# Patient Record
Sex: Female | Born: 1950 | Race: White | Hispanic: No | Marital: Married | Smoking: Never smoker
Health system: Southern US, Community
[De-identification: ages and names within clinical notes are randomized; demographics above are authoritative.]

## PROBLEM LIST (undated history)

## (undated) DIAGNOSIS — B029 Zoster without complications: Secondary | ICD-10-CM

## (undated) DIAGNOSIS — D0510 Intraductal carcinoma in situ of unspecified breast: Secondary | ICD-10-CM

## (undated) DIAGNOSIS — H269 Unspecified cataract: Secondary | ICD-10-CM

## (undated) DIAGNOSIS — Z87442 Personal history of urinary calculi: Secondary | ICD-10-CM

## (undated) DIAGNOSIS — Z853 Personal history of malignant neoplasm of breast: Secondary | ICD-10-CM

## (undated) DIAGNOSIS — R87619 Unspecified abnormal cytological findings in specimens from cervix uteri: Secondary | ICD-10-CM

## (undated) DIAGNOSIS — F419 Anxiety disorder, unspecified: Secondary | ICD-10-CM

## (undated) DIAGNOSIS — R011 Cardiac murmur, unspecified: Secondary | ICD-10-CM

## (undated) DIAGNOSIS — K589 Irritable bowel syndrome without diarrhea: Secondary | ICD-10-CM

## (undated) DIAGNOSIS — T7840XA Allergy, unspecified, initial encounter: Secondary | ICD-10-CM

## (undated) DIAGNOSIS — M8580 Other specified disorders of bone density and structure, unspecified site: Secondary | ICD-10-CM

## (undated) DIAGNOSIS — N189 Chronic kidney disease, unspecified: Secondary | ICD-10-CM

## (undated) DIAGNOSIS — Z923 Personal history of irradiation: Secondary | ICD-10-CM

## (undated) HISTORY — DX: Personal history of malignant neoplasm of breast: Z85.3

## (undated) HISTORY — DX: Cardiac murmur, unspecified: R01.1

## (undated) HISTORY — DX: Unspecified abnormal cytological findings in specimens from cervix uteri: R87.619

## (undated) HISTORY — PX: WISDOM TOOTH EXTRACTION: SHX21

## (undated) HISTORY — DX: Irritable bowel syndrome, unspecified: K58.9

## (undated) HISTORY — DX: Unspecified cataract: H26.9

## (undated) HISTORY — DX: Other specified disorders of bone density and structure, unspecified site: M85.80

## (undated) HISTORY — DX: Allergy, unspecified, initial encounter: T78.40XA

## (undated) HISTORY — PX: CARPAL TUNNEL RELEASE: SHX101

## (undated) HISTORY — DX: Zoster without complications: B02.9

## (undated) HISTORY — DX: Intraductal carcinoma in situ of unspecified breast: D05.10

## (undated) HISTORY — DX: Anxiety disorder, unspecified: F41.9

---

## 1984-03-28 HISTORY — PX: OTHER SURGICAL HISTORY: SHX169

## 1997-09-24 ENCOUNTER — Other Ambulatory Visit: Admission: RE | Admit: 1997-09-24 | Discharge: 1997-09-24 | Payer: Self-pay | Admitting: *Deleted

## 1998-09-30 ENCOUNTER — Other Ambulatory Visit: Admission: RE | Admit: 1998-09-30 | Discharge: 1998-09-30 | Payer: Self-pay | Admitting: *Deleted

## 1998-10-12 ENCOUNTER — Ambulatory Visit (HOSPITAL_COMMUNITY): Admission: RE | Admit: 1998-10-12 | Discharge: 1998-10-12 | Payer: Self-pay | Admitting: *Deleted

## 1999-01-27 ENCOUNTER — Other Ambulatory Visit: Admission: RE | Admit: 1999-01-27 | Discharge: 1999-01-27 | Payer: Self-pay | Admitting: *Deleted

## 1999-02-10 ENCOUNTER — Encounter (INDEPENDENT_AMBULATORY_CARE_PROVIDER_SITE_OTHER): Payer: Self-pay

## 1999-02-10 ENCOUNTER — Other Ambulatory Visit: Admission: RE | Admit: 1999-02-10 | Discharge: 1999-02-10 | Payer: Self-pay | Admitting: *Deleted

## 1999-06-23 ENCOUNTER — Other Ambulatory Visit: Admission: RE | Admit: 1999-06-23 | Discharge: 1999-06-23 | Payer: Self-pay | Admitting: *Deleted

## 1999-09-22 ENCOUNTER — Other Ambulatory Visit: Admission: RE | Admit: 1999-09-22 | Discharge: 1999-09-22 | Payer: Self-pay | Admitting: *Deleted

## 1999-10-14 ENCOUNTER — Encounter: Payer: Self-pay | Admitting: General Surgery

## 1999-10-14 ENCOUNTER — Ambulatory Visit (HOSPITAL_COMMUNITY): Admission: RE | Admit: 1999-10-14 | Discharge: 1999-10-14 | Payer: Self-pay | Admitting: General Surgery

## 2000-10-20 ENCOUNTER — Other Ambulatory Visit: Admission: RE | Admit: 2000-10-20 | Discharge: 2000-10-20 | Payer: Self-pay | Admitting: *Deleted

## 2000-10-24 ENCOUNTER — Encounter: Payer: Self-pay | Admitting: *Deleted

## 2000-10-24 ENCOUNTER — Ambulatory Visit (HOSPITAL_COMMUNITY): Admission: RE | Admit: 2000-10-24 | Discharge: 2000-10-24 | Payer: Self-pay | Admitting: *Deleted

## 2001-11-23 ENCOUNTER — Encounter: Payer: Self-pay | Admitting: *Deleted

## 2001-11-23 ENCOUNTER — Ambulatory Visit (HOSPITAL_COMMUNITY): Admission: RE | Admit: 2001-11-23 | Discharge: 2001-11-23 | Payer: Self-pay | Admitting: *Deleted

## 2001-11-23 ENCOUNTER — Other Ambulatory Visit: Admission: RE | Admit: 2001-11-23 | Discharge: 2001-11-23 | Payer: Self-pay | Admitting: *Deleted

## 2002-12-11 ENCOUNTER — Ambulatory Visit (HOSPITAL_COMMUNITY): Admission: RE | Admit: 2002-12-11 | Discharge: 2002-12-11 | Payer: Self-pay | Admitting: *Deleted

## 2002-12-11 ENCOUNTER — Encounter: Payer: Self-pay | Admitting: *Deleted

## 2002-12-11 ENCOUNTER — Other Ambulatory Visit: Admission: RE | Admit: 2002-12-11 | Discharge: 2002-12-11 | Payer: Self-pay | Admitting: *Deleted

## 2004-01-12 ENCOUNTER — Ambulatory Visit (HOSPITAL_COMMUNITY): Admission: RE | Admit: 2004-01-12 | Discharge: 2004-01-12 | Payer: Self-pay | Admitting: General Surgery

## 2004-01-12 ENCOUNTER — Other Ambulatory Visit: Admission: RE | Admit: 2004-01-12 | Discharge: 2004-01-12 | Payer: Self-pay | Admitting: *Deleted

## 2004-12-13 ENCOUNTER — Ambulatory Visit (HOSPITAL_COMMUNITY): Admission: RE | Admit: 2004-12-13 | Discharge: 2004-12-13 | Payer: Self-pay | Admitting: General Surgery

## 2004-12-13 ENCOUNTER — Other Ambulatory Visit: Admission: RE | Admit: 2004-12-13 | Discharge: 2004-12-13 | Payer: Self-pay | Admitting: *Deleted

## 2005-07-26 HISTORY — PX: COLONOSCOPY: SHX174

## 2005-12-15 ENCOUNTER — Ambulatory Visit (HOSPITAL_COMMUNITY): Admission: RE | Admit: 2005-12-15 | Discharge: 2005-12-15 | Payer: Self-pay | Admitting: General Surgery

## 2006-01-26 ENCOUNTER — Other Ambulatory Visit: Admission: RE | Admit: 2006-01-26 | Discharge: 2006-01-26 | Payer: Self-pay | Admitting: Obstetrics & Gynecology

## 2007-01-27 DIAGNOSIS — D0510 Intraductal carcinoma in situ of unspecified breast: Secondary | ICD-10-CM

## 2007-01-27 HISTORY — PX: BREAST EXCISIONAL BIOPSY: SUR124

## 2007-01-27 HISTORY — PX: BREAST LUMPECTOMY: SHX2

## 2007-01-27 HISTORY — DX: Intraductal carcinoma in situ of unspecified breast: D05.10

## 2007-02-01 ENCOUNTER — Ambulatory Visit (HOSPITAL_COMMUNITY): Admission: RE | Admit: 2007-02-01 | Discharge: 2007-02-01 | Payer: Self-pay | Admitting: General Surgery

## 2007-02-01 ENCOUNTER — Other Ambulatory Visit: Admission: RE | Admit: 2007-02-01 | Discharge: 2007-02-01 | Payer: Self-pay | Admitting: Obstetrics & Gynecology

## 2007-02-05 ENCOUNTER — Encounter: Admission: RE | Admit: 2007-02-05 | Discharge: 2007-02-05 | Payer: Self-pay | Admitting: Obstetrics & Gynecology

## 2007-02-07 ENCOUNTER — Encounter (INDEPENDENT_AMBULATORY_CARE_PROVIDER_SITE_OTHER): Payer: Self-pay | Admitting: Diagnostic Radiology

## 2007-02-07 ENCOUNTER — Encounter: Admission: RE | Admit: 2007-02-07 | Discharge: 2007-02-07 | Payer: Self-pay | Admitting: Obstetrics & Gynecology

## 2007-02-07 DIAGNOSIS — Z853 Personal history of malignant neoplasm of breast: Secondary | ICD-10-CM | POA: Insufficient documentation

## 2007-02-13 ENCOUNTER — Encounter: Admission: RE | Admit: 2007-02-13 | Discharge: 2007-02-13 | Payer: Self-pay | Admitting: Obstetrics & Gynecology

## 2007-02-15 ENCOUNTER — Encounter: Admission: RE | Admit: 2007-02-15 | Discharge: 2007-02-15 | Payer: Self-pay | Admitting: Surgery

## 2007-02-15 ENCOUNTER — Encounter (INDEPENDENT_AMBULATORY_CARE_PROVIDER_SITE_OTHER): Payer: Self-pay | Admitting: Surgery

## 2007-02-15 ENCOUNTER — Ambulatory Visit (HOSPITAL_COMMUNITY): Admission: RE | Admit: 2007-02-15 | Discharge: 2007-02-15 | Payer: Self-pay | Admitting: Surgery

## 2007-02-26 ENCOUNTER — Ambulatory Visit: Payer: Self-pay | Admitting: Oncology

## 2007-03-05 ENCOUNTER — Ambulatory Visit: Admission: RE | Admit: 2007-03-05 | Discharge: 2007-03-28 | Payer: Self-pay | Admitting: Radiation Oncology

## 2007-03-19 LAB — CBC WITH DIFFERENTIAL/PLATELET
BASO%: 0.5 % (ref 0.0–2.0)
Basophils Absolute: 0 10*3/uL (ref 0.0–0.1)
EOS%: 0.9 % (ref 0.0–7.0)
LYMPH%: 29.1 % (ref 14.0–48.0)
MCV: 92.8 fL (ref 81.0–101.0)
MONO#: 0.4 10*3/uL (ref 0.1–0.9)
MONO%: 7.6 % (ref 0.0–13.0)
RBC: 3.6 10*6/uL — ABNORMAL LOW (ref 3.70–5.32)
RDW: 13.9 % (ref 11.3–14.5)
WBC: 4.8 10*3/uL (ref 3.9–10.0)

## 2007-03-19 LAB — COMPREHENSIVE METABOLIC PANEL
Albumin: 4.4 g/dL (ref 3.5–5.2)
CO2: 28 mEq/L (ref 19–32)
Creatinine, Ser: 1.05 mg/dL (ref 0.40–1.20)
Potassium: 3.7 mEq/L (ref 3.5–5.3)
Sodium: 138 mEq/L (ref 135–145)
Total Bilirubin: 0.6 mg/dL (ref 0.3–1.2)
Total Protein: 7.2 g/dL (ref 6.0–8.3)

## 2007-03-23 LAB — VITAMIN D PNL(25-HYDRXY+1,25-DIHY)-BLD
Vit D, 1,25-Dihydroxy: 45 pg/mL (ref 6–62)
Vit D, 25-Hydroxy: 45 ng/mL (ref 30–89)

## 2007-03-29 ENCOUNTER — Ambulatory Visit: Admission: RE | Admit: 2007-03-29 | Discharge: 2007-06-01 | Payer: Self-pay | Admitting: Radiation Oncology

## 2008-01-10 ENCOUNTER — Encounter: Admission: RE | Admit: 2008-01-10 | Discharge: 2008-01-10 | Payer: Self-pay | Admitting: Surgery

## 2008-02-14 ENCOUNTER — Other Ambulatory Visit: Admission: RE | Admit: 2008-02-14 | Discharge: 2008-02-14 | Payer: Self-pay | Admitting: Obstetrics & Gynecology

## 2009-01-09 IMAGING — MG MM DIGITAL SCREENING BILAT
4 series · 4 of 4 positions shown · non-contrast
Comparison: none

DG SCREEN MAMMOGRAM BILATERAL
Bilateral CC and MLO view(s) were taken.

DIGITAL SCREENING MAMMOGRAM WITH CAD:
There are scattered fibroglandular densities.  Microcalcifications are present in the right breast.
Characterization with magnification views is recommended.  No mass or malignant type 
calcifications are identified in the left breast.  Compared with prior studies.

[R CC]
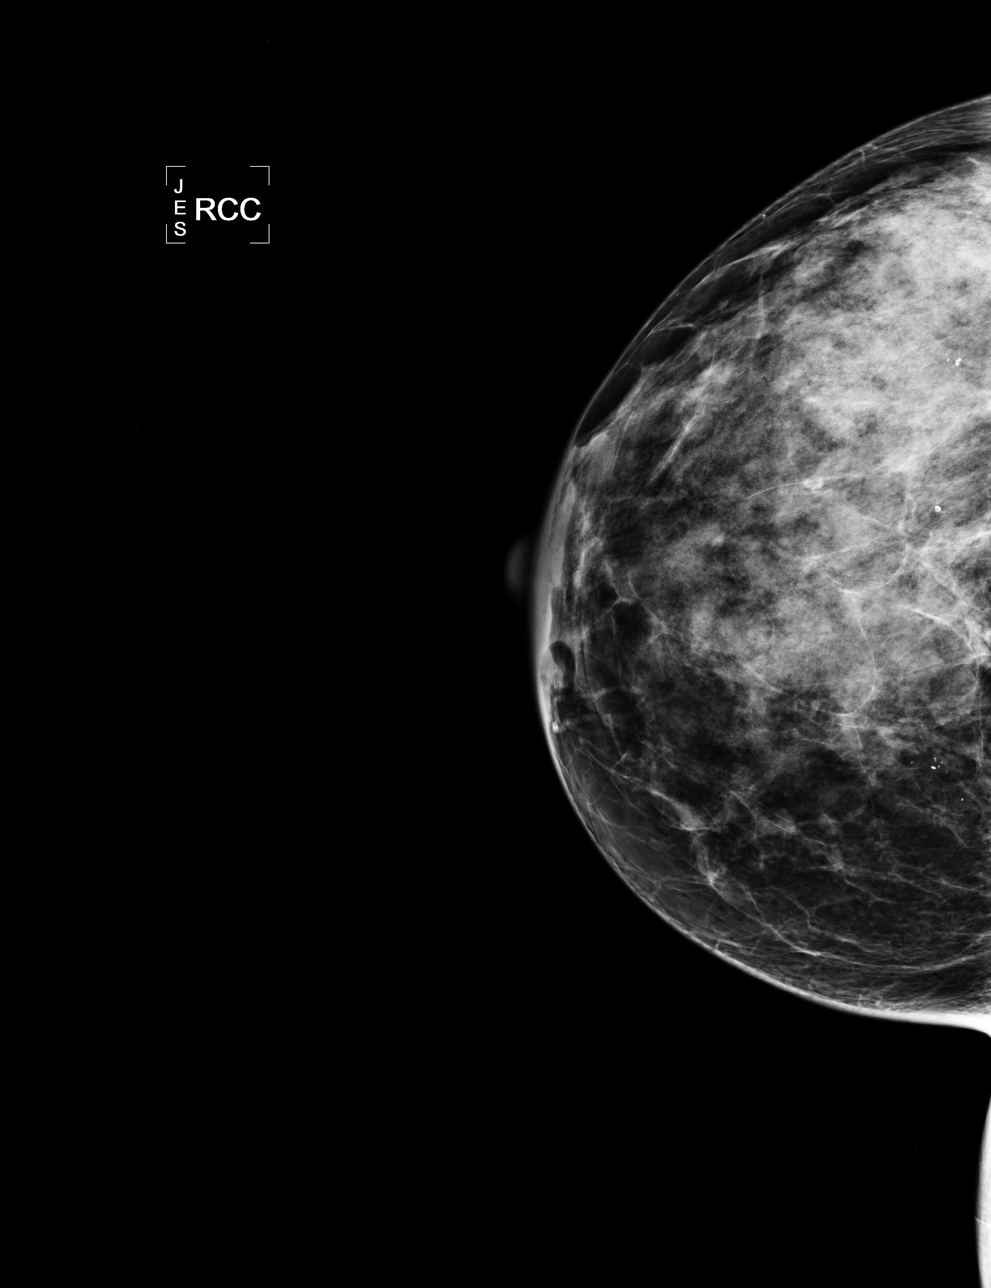

[R MLO]
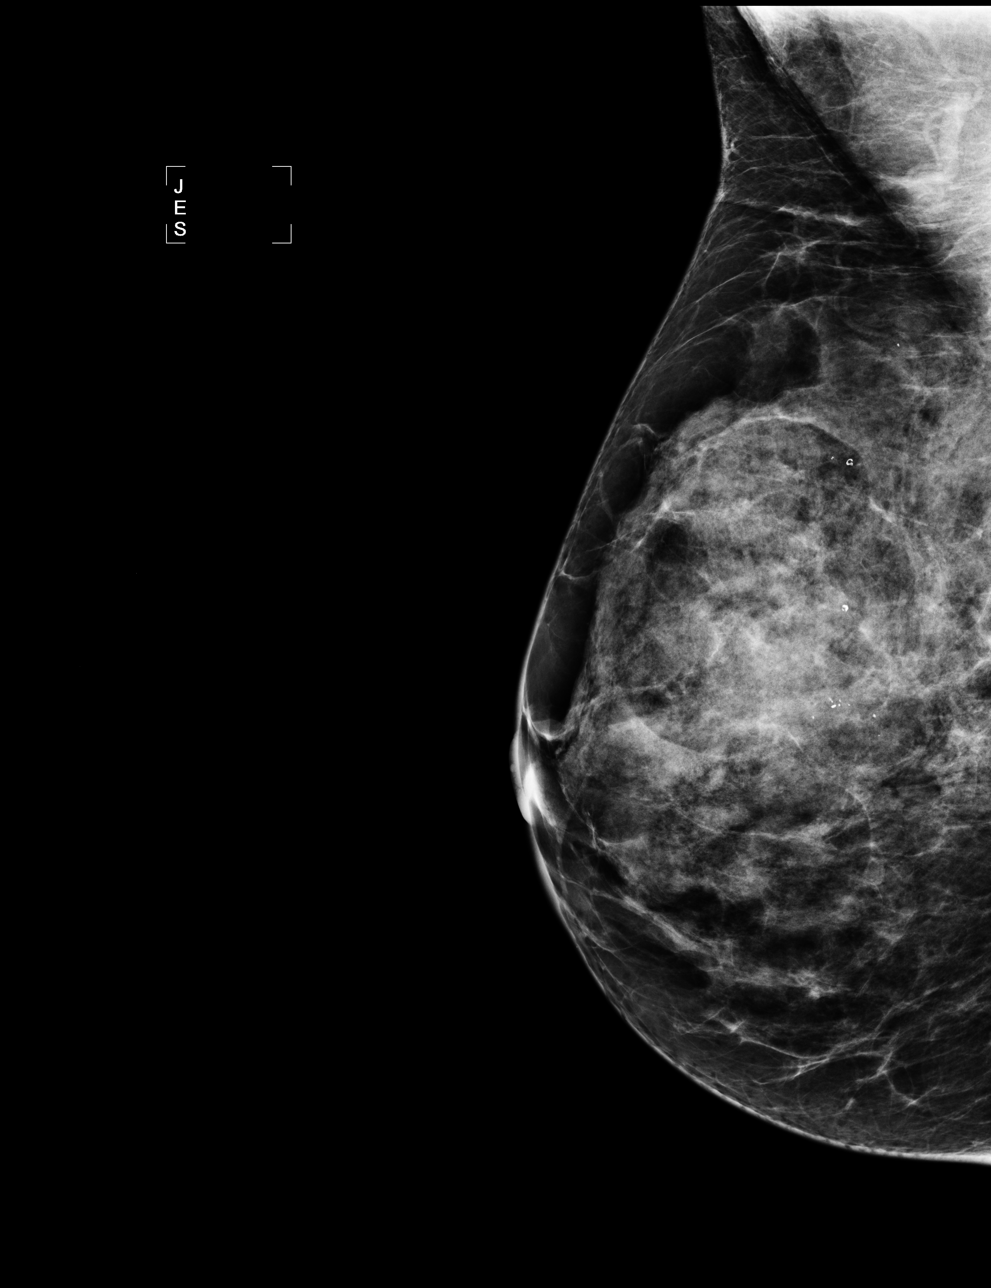

[L CC]
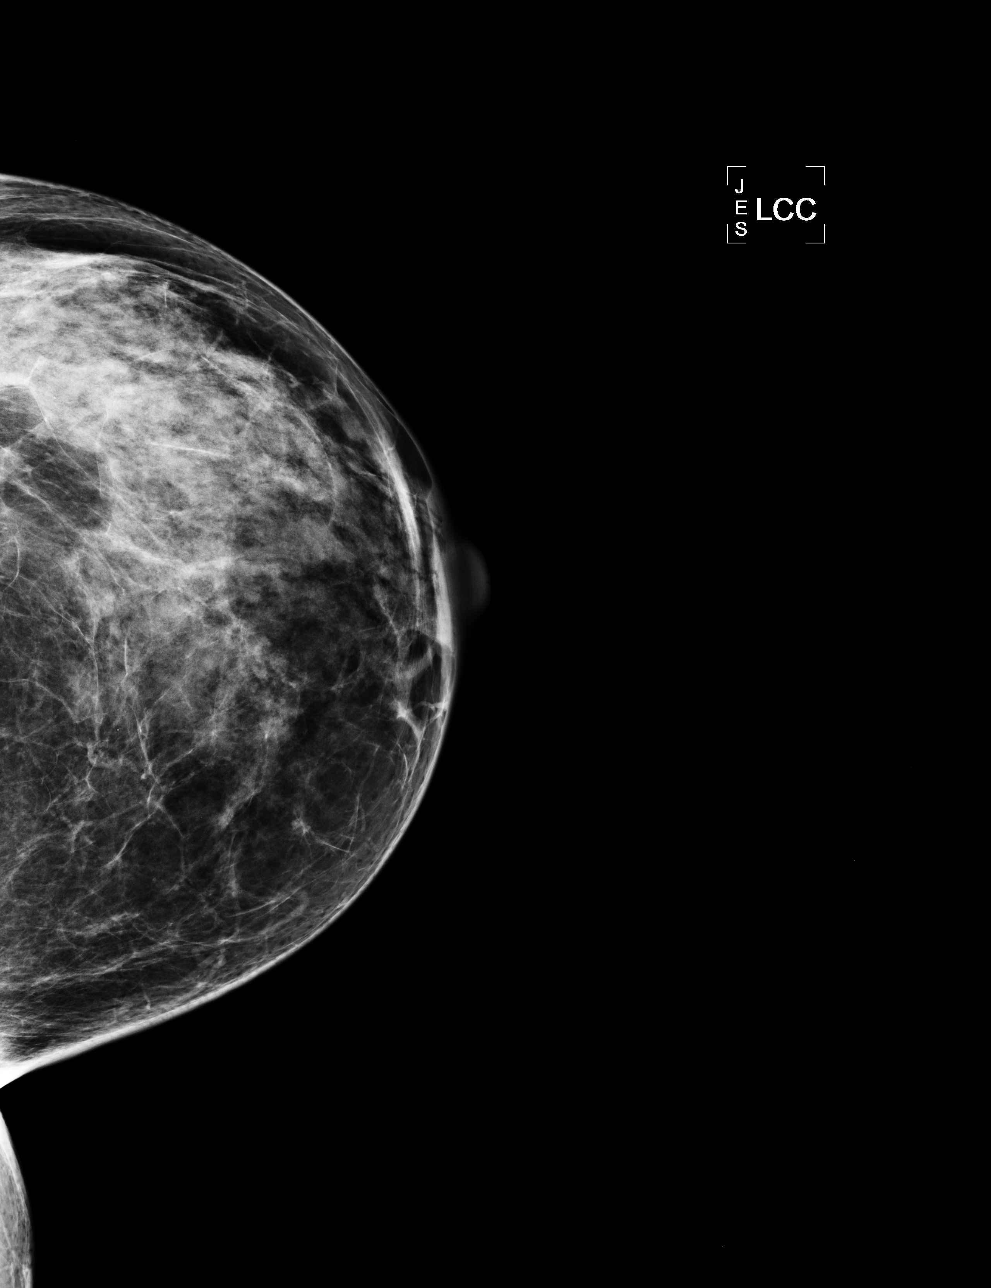

[L MLO]
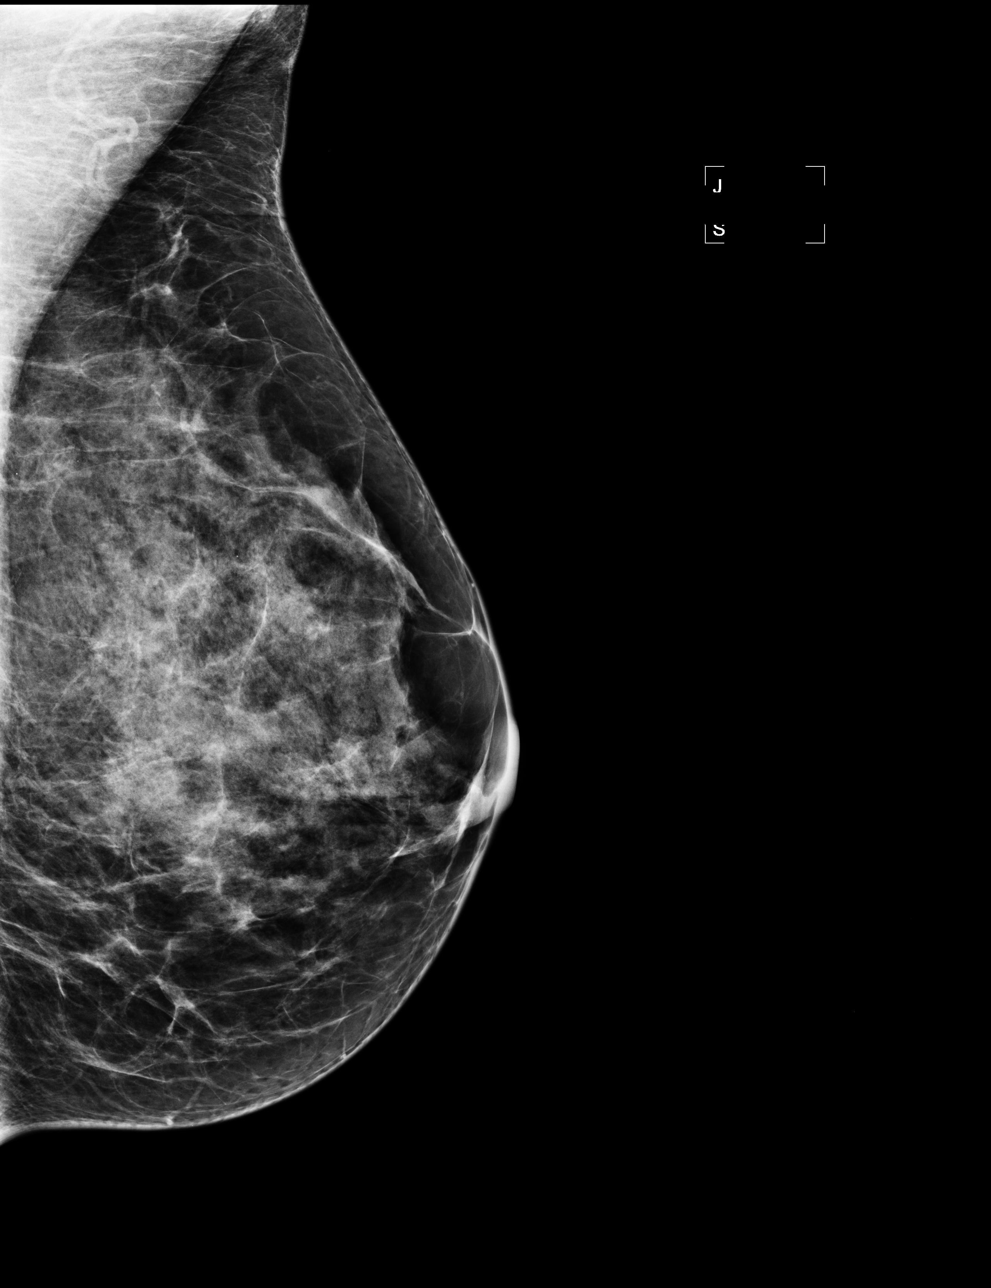

[4 of 4 positions shown; findings below may reference images not displayed]

IMPRESSION: Calcifications, right breast.  Additional evaluation is indicated. The patient will be contacted 
for additional studies and a supplementary report will follow.  No specific mammographic evidence 
of malignancy, left breast.

ASSESSMENT: Need additional imaging evaluation and/or prior mammograms for comparison - BI-RADS 0

Further imaging of the right breast.
ANALYZED BY COMPUTER AIDED DETECTION. , THIS PROCEDURE WAS A DIGITAL MAMMOGRAM.

## 2009-01-12 ENCOUNTER — Encounter: Admission: RE | Admit: 2009-01-12 | Discharge: 2009-01-12 | Payer: Self-pay | Admitting: Surgery

## 2010-01-13 ENCOUNTER — Encounter: Admission: RE | Admit: 2010-01-13 | Discharge: 2010-01-13 | Payer: Self-pay | Admitting: Surgery

## 2010-03-28 DIAGNOSIS — B029 Zoster without complications: Secondary | ICD-10-CM

## 2010-03-28 HISTORY — DX: Zoster without complications: B02.9

## 2010-05-04 ENCOUNTER — Other Ambulatory Visit: Payer: Self-pay | Admitting: Dermatology

## 2010-06-23 ENCOUNTER — Other Ambulatory Visit: Payer: Self-pay | Admitting: Obstetrics & Gynecology

## 2010-06-23 DIAGNOSIS — Z853 Personal history of malignant neoplasm of breast: Secondary | ICD-10-CM

## 2010-06-23 DIAGNOSIS — Z780 Asymptomatic menopausal state: Secondary | ICD-10-CM

## 2010-07-12 ENCOUNTER — Ambulatory Visit
Admission: RE | Admit: 2010-07-12 | Discharge: 2010-07-12 | Disposition: A | Discharge status: Home / Self Care | Payer: BLUE CROSS/BLUE SHIELD | Source: Ambulatory Visit | Attending: Obstetrics & Gynecology | Admitting: Obstetrics & Gynecology

## 2010-07-12 DIAGNOSIS — Z853 Personal history of malignant neoplasm of breast: Secondary | ICD-10-CM

## 2010-07-12 DIAGNOSIS — Z780 Asymptomatic menopausal state: Secondary | ICD-10-CM

## 2010-08-10 NOTE — Op Note (Signed)
Robin Young, Robin Young                ACCOUNT NO.:  [id number]   MEDICAL RECORD NO.:  [id number]          PATIENT TYPE:  AMB   LOCATION:  SDS                          FACILITY:  [facility]   PHYSICIAN:  Currie Paris, M.D.DATE OF BIRTH:  19-Mar-1951   DATE OF PROCEDURE:  02/15/2007  DATE OF DISCHARGE:  02/15/2007                               OPERATIVE REPORT   PREOPERATIVE DIAGNOSIS:  Ductal carcinoma in situ, right breast upper  inner quadrant.   POSTOPERATIVE DIAGNOSIS:  Ductal carcinoma in situ, right breast upper  inner quadrant.   OPERATION:  Needle guided wide local excision.   SURGEON:  Currie Paris, M.D.   ANESTHESIA:  General.   CLINICAL HISTORY:  This is a 60 year old lady with what appeared to be a  fairly small area of DCIS in the right breast upper inner quadrant.  After a discussion with the patient, she elected to proceed to  lumpectomy.   DESCRIPTION OF PROCEDURE:  The patient was seen in the holding area and  she had no further questions.  We identified and marked the right breast  as the operative side.  I reviewed the guidewire localization films and  we appeared to have good positioning.  The patient was taken to the  operating room. After satisfactory general anesthesia had been obtained,  the right breast was prepped and draped as a sterile field.  The time  out was performed.   The guidewire entered superomedially just almost out of the breast and  tracked directly down towards the nipple.  I made a curvilinear incision  about halfway between the guidewire entry site and the areolar margin.  As soon as I entered the subcutaneous tissues, I raised a very thin  subcutaneous flap to get over to the guidewire entry site and  manipulated the guidewire into the wound.  I used the cautery to divide  some of the tissue of the breast just medial to the wire down towards  the chest wall and then grasped the two sides of breast tissue on either  side of the  guidewire with some Allis clamps and used that for  elevation.  I continued to divide the breast tissue medial to the  guidewire until I got down to the chest wall, then came under it, taking  the fascia, and then divided with cautery the breast tissue on either  side of the guidewire until we got down towards the nipple areolar area  where I was finally able to then disconnect the final attachments of  breast tissue right at the areolar margin.  I did not encounter the tip  of the guidewire so I felt I was well beyond the tip and had completely  encompassed this area.   I spent several minutes making sure everything was dry.  I injected some  Marcaine to help with postop analgesia.  I irrigated.  I then closed a  little bit of the deeper breast tissue to cover some of the muscle using  3-0 Vicryl, but I could not really close the defect because it was too  large  and would cause significant deformity and I thought the  best plan would be to allow a seroma cavity here to form.  I irrigated,  again, and made sure everything was dry, and then closed the incision  with 3-0 Vicryl followed by 4-0 Monocryl subcuticular and Dermabond.  The patient tolerated the procedure well and there were no operative  complications and all counts were correct.      Currie Paris, M.D.  Electronically Signed     CJS/MEDQ  D:  02/15/2007  T:  02/16/2007  Job:  [id number]   cc:   Candyce Churn, M.D.

## 2010-08-18 ENCOUNTER — Encounter (INDEPENDENT_AMBULATORY_CARE_PROVIDER_SITE_OTHER): Payer: Self-pay | Admitting: Surgery

## 2010-12-02 ENCOUNTER — Other Ambulatory Visit (INDEPENDENT_AMBULATORY_CARE_PROVIDER_SITE_OTHER): Payer: Self-pay | Admitting: Surgery

## 2010-12-02 DIAGNOSIS — Z1231 Encounter for screening mammogram for malignant neoplasm of breast: Secondary | ICD-10-CM

## 2010-12-15 ENCOUNTER — Other Ambulatory Visit (INDEPENDENT_AMBULATORY_CARE_PROVIDER_SITE_OTHER): Payer: Self-pay | Admitting: Surgery

## 2010-12-15 ENCOUNTER — Ambulatory Visit
Admission: RE | Admit: 2010-12-15 | Discharge: 2010-12-15 | Disposition: A | Discharge status: Home / Self Care | Payer: BLUE CROSS/BLUE SHIELD | Source: Ambulatory Visit | Attending: Surgery | Admitting: Surgery

## 2010-12-15 DIAGNOSIS — C50919 Malignant neoplasm of unspecified site of unspecified female breast: Secondary | ICD-10-CM

## 2010-12-15 DIAGNOSIS — Z1231 Encounter for screening mammogram for malignant neoplasm of breast: Secondary | ICD-10-CM

## 2011-01-04 LAB — DIFFERENTIAL
Basophils Absolute: 0
Basophils Relative: 1
Monocytes Relative: 6
Neutro Abs: 4
Neutrophils Relative %: 70

## 2011-01-04 LAB — PROTIME-INR
INR: 1.1
Prothrombin Time: 13.9

## 2011-01-04 LAB — CBC
MCHC: 33.9
Platelets: 208

## 2011-01-04 LAB — COMPREHENSIVE METABOLIC PANEL
ALT: 14
AST: 31
CO2: 27
Chloride: 101
Creatinine, Ser: 1.07
GFR calc Af Amer: >60
Potassium: 3.7
Sodium: 138
Total Bilirubin: 1.3 — ABNORMAL HIGH

## 2011-01-04 LAB — URINALYSIS, ROUTINE W REFLEX MICROSCOPIC
Glucose, UA: NEGATIVE
Hgb urine dipstick: NEGATIVE
Ketones, ur: NEGATIVE
Protein, ur: NEGATIVE
Urobilinogen, UA: 0.2

## 2011-01-19 ENCOUNTER — Ambulatory Visit: Payer: BLUE CROSS/BLUE SHIELD

## 2011-01-25 ENCOUNTER — Encounter (INDEPENDENT_AMBULATORY_CARE_PROVIDER_SITE_OTHER): Payer: Self-pay | Admitting: General Surgery

## 2011-01-27 ENCOUNTER — Ambulatory Visit (INDEPENDENT_AMBULATORY_CARE_PROVIDER_SITE_OTHER): Payer: Private Health Insurance - Indemnity | Admitting: Surgery

## 2011-02-25 ENCOUNTER — Encounter (INDEPENDENT_AMBULATORY_CARE_PROVIDER_SITE_OTHER): Payer: Self-pay | Admitting: Surgery

## 2011-02-25 ENCOUNTER — Ambulatory Visit (INDEPENDENT_AMBULATORY_CARE_PROVIDER_SITE_OTHER): Payer: Private Health Insurance - Indemnity | Admitting: Surgery

## 2011-02-25 DIAGNOSIS — Z853 Personal history of malignant neoplasm of breast: Secondary | ICD-10-CM

## 2011-02-25 NOTE — Progress Notes (Signed)
NAME: Robin Young       DOB: Jan 30, 1951           DATE: 02/25/2011       MRN: [id number]   Robin Young is a 60 y.o.Marland Kitchenfemale who presents for routine followup of her Right DCISdiagnosed in 2008 and treated with Lumpectomy and radiation. She has no problems or concerns on either side.  PFSH: She has had no significant changes since the last visit here.  ROS: There have been no significant changes since the last visit here  EXAM: General: The patient is alert, oriented, generally healty appearing, NAD. Mood and affect are normal.  Breasts:  Breasts are symmetric in appearance and there are virtually no post radiation changes noted. The lumpectomy site is soft with slight tissue loss. No suggestion of recurrence or new mass. Left side WNL  Lymphatics: She has no axillary or supraclavicular adenopathy on either side.  Extremities: Full ROM of the surgical side with no lymphedema noted.  Data Reviewed: Mammogram in September is OK  Impression: Doing well, with no evidence of recurrent cancer or new cancer  Plan: Will continue to follow up on an annual basis here.

## 2011-11-07 ENCOUNTER — Other Ambulatory Visit (INDEPENDENT_AMBULATORY_CARE_PROVIDER_SITE_OTHER): Payer: Self-pay | Admitting: Surgery

## 2011-11-07 DIAGNOSIS — Z853 Personal history of malignant neoplasm of breast: Secondary | ICD-10-CM

## 2011-11-07 DIAGNOSIS — Z9889 Other specified postprocedural states: Secondary | ICD-10-CM

## 2011-12-13 ENCOUNTER — Ambulatory Visit
Admission: RE | Admit: 2011-12-13 | Discharge: 2011-12-13 | Disposition: A | Discharge status: Home / Self Care | Source: Ambulatory Visit | Attending: Surgery | Admitting: Surgery

## 2011-12-13 DIAGNOSIS — Z853 Personal history of malignant neoplasm of breast: Secondary | ICD-10-CM

## 2011-12-13 DIAGNOSIS — Z9889 Other specified postprocedural states: Secondary | ICD-10-CM

## 2011-12-20 ENCOUNTER — Encounter

## 2011-12-23 ENCOUNTER — Telehealth (INDEPENDENT_AMBULATORY_CARE_PROVIDER_SITE_OTHER): Payer: Self-pay | Admitting: General Surgery

## 2011-12-23 NOTE — Telephone Encounter (Signed)
Discussed with Dr Jamey Ripa patient's need for annual follow up. He does not need to see patient again. She needs to follow up with her PCP and continue yearly mammograms. To see [place] on an as needed basis.

## 2012-04-10 ENCOUNTER — Encounter: Payer: Self-pay | Admitting: *Deleted

## 2012-04-11 ENCOUNTER — Encounter: Payer: Self-pay | Admitting: *Deleted

## 2012-04-17 ENCOUNTER — Encounter: Payer: Self-pay | Admitting: *Deleted

## 2012-10-19 ENCOUNTER — Other Ambulatory Visit (INDEPENDENT_AMBULATORY_CARE_PROVIDER_SITE_OTHER): Payer: Self-pay | Admitting: Surgery

## 2012-10-19 DIAGNOSIS — Z853 Personal history of malignant neoplasm of breast: Secondary | ICD-10-CM

## 2012-12-03 ENCOUNTER — Ambulatory Visit
Admission: RE | Admit: 2012-12-03 | Discharge: 2012-12-03 | Disposition: A | Discharge status: Home / Self Care | Payer: Managed Care, Other (non HMO) | Source: Ambulatory Visit | Attending: Surgery | Admitting: Surgery

## 2012-12-03 DIAGNOSIS — Z853 Personal history of malignant neoplasm of breast: Secondary | ICD-10-CM

## 2013-08-16 ENCOUNTER — Ambulatory Visit: Payer: Self-pay | Admitting: Obstetrics & Gynecology

## 2013-08-30 ENCOUNTER — Encounter: Payer: Self-pay | Admitting: Obstetrics & Gynecology

## 2013-08-30 ENCOUNTER — Ambulatory Visit (INDEPENDENT_AMBULATORY_CARE_PROVIDER_SITE_OTHER): Payer: Managed Care, Other (non HMO) | Admitting: Obstetrics & Gynecology

## 2013-08-30 VITALS — BP 122/66 | HR 70 | Temp 98.4°F | Ht 64.5 in | Wt 131.6 lb

## 2013-08-30 DIAGNOSIS — Z0000 Encounter for general adult medical examination without abnormal findings: Secondary | ICD-10-CM

## 2013-08-30 DIAGNOSIS — Z01419 Encounter for gynecological examination (general) (routine) without abnormal findings: Secondary | ICD-10-CM

## 2013-08-30 DIAGNOSIS — Z124 Encounter for screening for malignant neoplasm of cervix: Secondary | ICD-10-CM

## 2013-08-30 DIAGNOSIS — Z23 Encounter for immunization: Secondary | ICD-10-CM

## 2013-08-30 LAB — POCT URINALYSIS DIPSTICK
Bilirubin, UA: NEGATIVE
Blood, UA: NEGATIVE
GLUCOSE UA: NEGATIVE
Ketones, UA: NEGATIVE
LEUKOCYTES UA: NEGATIVE
NITRITE UA: NEGATIVE
Protein, UA: NEGATIVE
UROBILINOGEN UA: NEGATIVE
pH, UA: 5

## 2013-08-30 LAB — COMPREHENSIVE METABOLIC PANEL
ALK PHOS: 77 U/L (ref 39–117)
ALT: 8 U/L (ref 0–35)
AST: 23 U/L (ref 0–37)
Albumin: 4.5 g/dL (ref 3.5–5.2)
BILIRUBIN TOTAL: 1 mg/dL (ref 0.2–1.2)
BUN: 17 mg/dL (ref 6–23)
CO2: 28 mEq/L (ref 19–32)
CREATININE: 0.99 mg/dL (ref 0.50–1.10)
Calcium: 9.7 mg/dL (ref 8.4–10.5)
Chloride: 101 mEq/L (ref 96–112)
Glucose, Bld: 97 mg/dL (ref 70–99)
Potassium: 4 mEq/L (ref 3.5–5.3)
Sodium: 140 mEq/L (ref 135–145)
Total Protein: 7.2 g/dL (ref 6.0–8.3)

## 2013-08-30 LAB — LIPID PANEL
CHOL/HDL RATIO: 2.7 ratio
Cholesterol: 216 mg/dL — ABNORMAL HIGH (ref 0–200)
HDL: 81 mg/dL (ref >39)
LDL CALC: 118 mg/dL — AB (ref 0–99)
TRIGLYCERIDES: 83 mg/dL (ref <150)
VLDL: 17 mg/dL (ref 0–40)

## 2013-08-30 MED ORDER — ACYCLOVIR 5 % EX OINT: 1.0000 "application " | TOPICAL_OINTMENT | CUTANEOUS | Status: DC

## 2013-08-30 NOTE — Patient Instructions (Signed)

## 2013-08-30 NOTE — Progress Notes (Signed)
63 y.o. [id number] MarriedCaucasianF here for annual exam.  Doing well.  No vaginal bleeding.  Husband's job is being phased out.  He is going to look for work but he isn't really ready to retire fully.  Doing a lot of care for her parents.     Patient's last menstrual period was 11/26/2005.          Sexually active: yes  The current method of family planning is post menopausal status.    Exercising: yes  walking and biking Smoker:  no  Health Maintenance: Pap:  05/18/11 WNL/negative HR HPV History of abnormal Pap:  yes MMG:  12/03/12-yearly diag Colonoscopy:  5/07-repeat in 10 years BMD:   07/12/10 TDaP:  2005 Screening Labs: today, Hb today: 12.1, Urine today: negative   reports that she has never smoked. She has never used smokeless tobacco. She reports that she does not drink alcohol or use illicit drugs.  Past Medical History  Diagnosis Date  . History of breast cancer   . Osteopenia   . Shingles   . IBS (irritable bowel syndrome)   . DCIS (ductal carcinoma in situ) of breast 01/2007    malignant    Past Surgical History  Procedure Laterality Date  . Breast lumpectomy  01/2007    right  . [place]    right  . Colonoscopy  07/2005  . Breast excisional biopsy  01/2007  . Carpal tunnel release      right    Current Outpatient Prescriptions  Medication Sig Dispense Refill  . aspirin 81 MG tablet Take 81 mg by mouth daily.        Mariane Baumgarten Calcium (STOOL SOFTENER PO) Take by mouth daily.      . Multiple Vitamin (MULTIVITAMIN PO) Take 0.5 tablets by mouth daily.       . Psyllium (METAMUCIL PO) Take 1 tablet by mouth 3 (three) times daily. caplets       No current facility-administered medications for this visit.    Family History  Problem Relation Age of Onset  . Breast cancer Maternal Grandmother   . Prostate cancer Father     ROS:  Pertinent items are noted in HPI.  Otherwise, a comprehensive ROS was negative.  Exam:   BP 122/66  Pulse 70  Temp(Src)  98.4 F (36.9 C) (Oral)  Ht 5' 4.5" (1.638 m)  Wt 131 lb 9.6 oz (59.693 kg)  BMI 22.25 kg/m2  LMP 11/26/2005    Height: 5' 4.5" (163.8 cm)  Ht Readings from Last 3 Encounters:  08/30/13 5' 4.5" (1.638 m)    General appearance: alert, cooperative and appears stated age Head: Normocephalic, without obvious abnormality, atraumatic Neck: no adenopathy, supple, symmetrical, trachea midline and thyroid normal to inspection and palpation Lungs: clear to auscultation bilaterally Breasts: normal appearance, no masses or tenderness, well healed scar on right with radiation changes Heart: regular rate and rhythm Abdomen: soft, non-tender; bowel sounds normal; no masses,  no organomegaly Extremities: extremities normal, atraumatic, no cyanosis or edema Skin: Skin color, texture, turgor normal. No rashes or lesions Lymph nodes: Cervical, supraclavicular, and axillary nodes normal. No abnormal inguinal nodes palpated Neurologic: Grossly normal   Pelvic: External genitalia:  no lesions              Urethra:  normal appearing urethra with no masses, tenderness or lesions              Bartholins and Skenes: normal  Vagina: normal appearing vagina with normal color and discharge, no lesions              Cervix: absent              Pap taken: yes Bimanual Exam:  Uterus:  normal size, contour, position, consistency, mobility, non-tender              Adnexa: normal adnexa and no mass, fullness, tenderness               Rectovaginal: Confirms               Anus:  normal sphincter tone, no lesions  A:  Well Woman with normal exam H/O fibroids DCIS 11/08 Facial fever blisters  P:   Mammogram--does diagnostic MMGs yearly Pap smear today.  Neg HR HPV with neg Pap 2/13 Tdap today CMP, TSH, Vit D, Lipids Zovirax 5% ointment up to TID prn #15 gm/1RF return annually or prn  An After Visit Summary was printed and given to the patient.

## 2013-08-31 LAB — TSH: TSH: 1.969 u[IU]/mL (ref 0.350–4.500)

## 2013-08-31 LAB — VITAMIN D 25 HYDROXY (VIT D DEFICIENCY, FRACTURES): VIT D 25 HYDROXY: 54 ng/mL (ref 30–89)

## 2013-09-02 ENCOUNTER — Telehealth: Payer: Self-pay

## 2013-09-02 LAB — HEMOGLOBIN, FINGERSTICK: HEMOGLOBIN, FINGERSTICK: 12.1 g/dL (ref 12.0–16.0)

## 2013-09-02 NOTE — Telephone Encounter (Signed)
Message copied by Robley Fries on Mon Sep 02, 2013  9:04 AM ------      Message from: Megan Salon      Created: Mon Sep 02, 2013  5:38 AM       Inform CMP nl, TSH normal, Cholesterol is a little abnormal with total 216 and LDL 118 but TG's good, HDLs great.  No treatment needed.  Repeat in two years.  Vit D normal. ------

## 2013-09-02 NOTE — Telephone Encounter (Signed)
Lmtcb//kn 

## 2013-09-03 LAB — IPS PAP SMEAR ONLY

## 2013-09-09 NOTE — Telephone Encounter (Signed)
Patient aware of all results//kn

## 2013-10-21 ENCOUNTER — Other Ambulatory Visit: Payer: Self-pay | Admitting: Obstetrics & Gynecology

## 2013-10-21 DIAGNOSIS — Z853 Personal history of malignant neoplasm of breast: Secondary | ICD-10-CM

## 2013-10-21 DIAGNOSIS — Z9889 Other specified postprocedural states: Secondary | ICD-10-CM

## 2013-10-28 ENCOUNTER — Telehealth: Payer: Self-pay | Admitting: Obstetrics & Gynecology

## 2013-10-28 NOTE — Telephone Encounter (Signed)
Tried to reach patient back at work number as provided. Patient was unavailable as she is at lunch. Did not leave voicemail as this is not on the ROI as a number to be able to leave a message at. Will try patient's cell. Spoke with patient at patient's cell number provided. Patient states that Dr.Gates is out of the office today and the "urgent" doctor that they have does not handle things like this. Patient states that for going on 10 days she has had lower back pain that has turned into nerve pain. Patient has been taking 3 ibuprofen every 6-8 hours and exercising with little relief. Patient would like to know what Dr.Miller recommends as to who she could see today for evaluation without having to have a lot of tests or if there is anything that she recommends she could do to help the pain improve. Advised would send a message over to [place] and give patient a call back with further instructions and recommendations. Patient agreeable.

## 2013-10-28 NOTE — Telephone Encounter (Signed)
Spoke with patient. Advised of message as seen below from [place]. Patient agreeable and verbalizes understanding. Patient is appreciative of the advice from [place]. Will call back with any further questions or needs.   Routing to provider for final review. Patient agreeable to disposition. Will close encounter

## 2013-10-28 NOTE — Telephone Encounter (Signed)
Patient is calling saying she is having syatic pain and her primary is out of the office today and she claims miller has helped her with issues with things like this before. Wants to know what she could do.

## 2013-10-28 NOTE — Telephone Encounter (Signed)
I would go to urgent care on [address].  Most likely they will start her on a steroid for starters.

## 2013-11-29 ENCOUNTER — Ambulatory Visit: Payer: Self-pay | Admitting: Obstetrics & Gynecology

## 2013-12-05 ENCOUNTER — Ambulatory Visit
Admission: RE | Admit: 2013-12-05 | Discharge: 2013-12-05 | Disposition: A | Discharge status: Home / Self Care | Payer: Managed Care, Other (non HMO) | Source: Ambulatory Visit | Attending: Obstetrics & Gynecology | Admitting: Obstetrics & Gynecology

## 2013-12-05 DIAGNOSIS — Z9889 Other specified postprocedural states: Secondary | ICD-10-CM

## 2013-12-05 DIAGNOSIS — Z853 Personal history of malignant neoplasm of breast: Secondary | ICD-10-CM

## 2014-01-27 ENCOUNTER — Encounter: Payer: Self-pay | Admitting: Obstetrics & Gynecology

## 2014-09-19 ENCOUNTER — Ambulatory Visit: Payer: Managed Care, Other (non HMO) | Admitting: Obstetrics & Gynecology

## 2014-10-06 ENCOUNTER — Telehealth: Payer: Self-pay | Admitting: Obstetrics & Gynecology

## 2014-10-06 NOTE — Telephone Encounter (Signed)
Spoke with patient. Patient states that she had to move her aex and is unable to be seen until 2017. Asking if she needs to have labs performed before then. Advised will check for aex openings with Dr.Miller. Patient's last aex was 08/30/2013. Aex moved to 11/03/2014 at 1:30pm with Dr.Miller. Patient is agreeable to date and time and will have labs performed at that appointment.  Routing to provider for final review. Patient agreeable to disposition. Will close encounter.   Patient aware provider will review message and nurse will return call if any additional advice or change of disposition.

## 2014-10-06 NOTE — Telephone Encounter (Signed)
Patient would like to have labs done since she had to cancel appointment with Dr Sabra Heck for this year and is scheduled for next year.

## 2014-10-21 ENCOUNTER — Other Ambulatory Visit: Payer: Self-pay | Admitting: Obstetrics & Gynecology

## 2014-10-21 DIAGNOSIS — Z1231 Encounter for screening mammogram for malignant neoplasm of breast: Secondary | ICD-10-CM

## 2014-11-03 ENCOUNTER — Encounter: Payer: Self-pay | Admitting: Obstetrics & Gynecology

## 2014-11-03 ENCOUNTER — Ambulatory Visit (INDEPENDENT_AMBULATORY_CARE_PROVIDER_SITE_OTHER): Admitting: Obstetrics & Gynecology

## 2014-11-03 VITALS — BP 132/72 | HR 64 | Resp 12 | Ht 64.75 in | Wt 132.0 lb

## 2014-11-03 DIAGNOSIS — Z0000 Encounter for general adult medical examination without abnormal findings: Secondary | ICD-10-CM | POA: Diagnosis not present

## 2014-11-03 DIAGNOSIS — Z01419 Encounter for gynecological examination (general) (routine) without abnormal findings: Secondary | ICD-10-CM | POA: Diagnosis not present

## 2014-11-03 LAB — CBC
HCT: 35.2 % — ABNORMAL LOW (ref 36.0–46.0)
HEMOGLOBIN: 12 g/dL (ref 12.0–15.0)
MCH: 31.7 pg (ref 26.0–34.0)
MCHC: 34.1 g/dL (ref 30.0–36.0)
MCV: 92.9 fL (ref 78.0–100.0)
MPV: 9.4 fL (ref 8.6–12.4)
Platelets: 224 10*3/uL (ref 150–400)
RBC: 3.79 MIL/uL — AB (ref 3.87–5.11)
RDW: 13.6 % (ref 11.5–15.5)
WBC: 5.8 10*3/uL (ref 4.0–10.5)

## 2014-11-03 LAB — POCT URINALYSIS DIPSTICK
BILIRUBIN UA: NEGATIVE
Blood, UA: NEGATIVE
GLUCOSE UA: NEGATIVE
KETONES UA: NEGATIVE
Leukocytes, UA: NEGATIVE
NITRITE UA: NEGATIVE
PROTEIN UA: NEGATIVE
UROBILINOGEN UA: NEGATIVE
pH, UA: 5

## 2014-11-03 MED ORDER — ACYCLOVIR 5 % EX OINT: 1.0000 "application " | TOPICAL_OINTMENT | CUTANEOUS | Status: DC

## 2014-11-03 NOTE — Addendum Note (Signed)
Addended by: Robley Fries on: 11/03/2014 02:33 PM   Modules accepted: Orders, SmartSet

## 2014-11-03 NOTE — Progress Notes (Signed)
64 y.o. [id number] MarriedCaucasianF here for annual exam.  Still doing a lot of care for her parents.  Father in law died earlier this year.  Has had to go to [place], [place], several times due to helping her husband.    No vaginal bleeding.   PCP:  Dr. Inda Merlin.    Patient's last menstrual period was 11/26/2005.          Sexually active: Yes.    The current method of family planning is vasectomy.    Exercising: Yes.    outdoor activities, yard maintenance Smoker:  no  Health Maintenance: Pap:  08/30/13 WNL (2/13 WNL/negative HR HPV) History of abnormal Pap:  Yes h/o ASCUS 2000, negative [place] MMG:  12/05/13 3D-BiRads 2-diag in one year Colonoscopy:  5/07-repeat in 10 years BMD:   07/12/10 TDaP:  08/30/13 Screening Labs: today, Hb today: today, Urine today: negative   reports that she has never smoked. She has never used smokeless tobacco. She reports that she does not drink alcohol or use illicit drugs.  Past Medical History  Diagnosis Date  . History of breast cancer   . Osteopenia   . Shingles 2012  . IBS (irritable bowel syndrome)   . DCIS (ductal carcinoma in situ) of breast 01/2007    malignant    Past Surgical History  Procedure Laterality Date  . Breast lumpectomy  01/2007    right  . [place]    right  . Colonoscopy  07/2005  . Breast excisional biopsy  01/2007  . Carpal tunnel release      right    Current Outpatient Prescriptions  Medication Sig Dispense Refill  . acyclovir ointment (ZOVIRAX) 5 % Apply 1 application topically every 3 (three) hours. 15 g 1  . aspirin 81 MG tablet Take 81 mg by mouth daily.      Mariane Baumgarten Calcium (STOOL SOFTENER PO) Take by mouth daily.    . Multiple Vitamin (MULTIVITAMIN PO) Take 0.5 tablets by mouth daily.     . Psyllium (METAMUCIL PO) Take 1 tablet by mouth 3 (three) times daily. caplets     No current facility-administered medications for this visit.    Family History  Problem Relation Age of Onset  . Breast  cancer Maternal Grandmother   . Prostate cancer Father   . Dementia Mother   . Transient ischemic attack Mother     ROS:  Pertinent items are noted in HPI.  Otherwise, a comprehensive ROS was negative.  Exam:   General appearance: alert, cooperative and appears stated age Head: Normocephalic, without obvious abnormality, atraumatic Neck: no adenopathy, supple, symmetrical, trachea midline and thyroid normal to inspection and palpation Lungs: clear to auscultation bilaterally Breasts: normal appearance, no masses or tenderness Heart: regular rate and rhythm Abdomen: soft, non-tender; bowel sounds normal; no masses,  no organomegaly Extremities: extremities normal, atraumatic, no cyanosis or edema Skin: Skin color, texture, turgor normal. No rashes or lesions Lymph nodes: Cervical, supraclavicular, and axillary nodes normal. No abnormal inguinal nodes palpated Neurologic: Grossly normal   Pelvic: External genitalia:  no lesions              Urethra:  normal appearing urethra with no masses, tenderness or lesions              Bartholins and Skenes: normal                 Vagina: normal appearing vagina with normal color and discharge, no lesions  Cervix: no lesions              Pap taken: No. Bimanual Exam:  Uterus: nodular uterus about 6-8 weeks, nodularity on left side noted              Adnexa: normal adnexa and no mass, fullness, tenderness, significant constipation noted on physical exam today               Rectovaginal: Confirms               Anus:  normal sphincter tone, no lesions  Chaperone was present for exam.  A:  Well Woman with normal exam H/O uterine ibroids DCIS 11/08 Facial fever blisters Constipation  P: Mammogram--does diagnostic MMGs yearly Neg Pap 2015. Neg HR HPV with neg Pap 2/13.  No pap today CMP, CBC, and Lipids today Zovirax 5% ointment up to TID prn #15 gm/1RF return annually or prn

## 2014-11-04 LAB — COMPREHENSIVE METABOLIC PANEL
ALBUMIN: 4.4 g/dL (ref 3.6–5.1)
ALT: 6 U/L (ref 6–29)
AST: 22 U/L (ref 10–35)
Alkaline Phosphatase: 73 U/L (ref 33–130)
BILIRUBIN TOTAL: 1.3 mg/dL — AB (ref 0.2–1.2)
BUN: 19 mg/dL (ref 7–25)
CHLORIDE: 102 mmol/L (ref 98–110)
CO2: 28 mmol/L (ref 20–31)
CREATININE: 0.98 mg/dL (ref 0.50–0.99)
Calcium: 9.3 mg/dL (ref 8.6–10.4)
GLUCOSE: 98 mg/dL (ref 65–99)
POTASSIUM: 4.2 mmol/L (ref 3.5–5.3)
Sodium: 139 mmol/L (ref 135–146)
Total Protein: 7.1 g/dL (ref 6.1–8.1)

## 2014-11-04 LAB — LIPID PANEL
Cholesterol: 235 mg/dL — ABNORMAL HIGH (ref 125–200)
HDL: 97 mg/dL (ref >=46)
LDL CALC: 127 mg/dL (ref <130)
TRIGLYCERIDES: 57 mg/dL (ref <150)
Total CHOL/HDL Ratio: 2.4 Ratio (ref <=5.0)
VLDL: 11 mg/dL (ref <30)

## 2014-12-11 ENCOUNTER — Other Ambulatory Visit: Payer: Self-pay | Admitting: Obstetrics & Gynecology

## 2014-12-11 ENCOUNTER — Ambulatory Visit
Admission: RE | Admit: 2014-12-11 | Discharge: 2014-12-11 | Disposition: A | Discharge status: Home / Self Care | Source: Ambulatory Visit | Attending: Obstetrics & Gynecology | Admitting: Obstetrics & Gynecology

## 2014-12-11 DIAGNOSIS — Z1231 Encounter for screening mammogram for malignant neoplasm of breast: Secondary | ICD-10-CM

## 2015-10-30 ENCOUNTER — Ambulatory Visit
Admission: RE | Admit: 2015-10-30 | Discharge: 2015-10-30 | Disposition: A | Discharge status: Home / Self Care | Source: Ambulatory Visit | Attending: Internal Medicine | Admitting: Internal Medicine

## 2015-10-30 ENCOUNTER — Other Ambulatory Visit: Payer: Self-pay | Admitting: Internal Medicine

## 2015-10-30 DIAGNOSIS — R0789 Other chest pain: Secondary | ICD-10-CM

## 2015-11-09 ENCOUNTER — Other Ambulatory Visit: Payer: Self-pay | Admitting: Obstetrics & Gynecology

## 2015-11-09 DIAGNOSIS — Z1231 Encounter for screening mammogram for malignant neoplasm of breast: Secondary | ICD-10-CM

## 2015-12-04 ENCOUNTER — Ambulatory Visit: Payer: Managed Care, Other (non HMO) | Admitting: Obstetrics & Gynecology

## 2015-12-15 ENCOUNTER — Ambulatory Visit

## 2015-12-15 ENCOUNTER — Ambulatory Visit
Admission: RE | Admit: 2015-12-15 | Discharge: 2015-12-15 | Disposition: A | Discharge status: Home / Self Care | Source: Ambulatory Visit | Attending: Obstetrics & Gynecology | Admitting: Obstetrics & Gynecology

## 2015-12-15 DIAGNOSIS — Z1231 Encounter for screening mammogram for malignant neoplasm of breast: Secondary | ICD-10-CM

## 2016-02-11 ENCOUNTER — Ambulatory Visit (INDEPENDENT_AMBULATORY_CARE_PROVIDER_SITE_OTHER): Admitting: Obstetrics & Gynecology

## 2016-02-11 ENCOUNTER — Encounter: Payer: Self-pay | Admitting: Obstetrics & Gynecology

## 2016-02-11 VITALS — BP 116/64 | HR 80 | Resp 16 | Ht 64.0 in | Wt 134.2 lb

## 2016-02-11 DIAGNOSIS — Z124 Encounter for screening for malignant neoplasm of cervix: Secondary | ICD-10-CM | POA: Diagnosis not present

## 2016-02-11 DIAGNOSIS — Z205 Contact with and (suspected) exposure to viral hepatitis: Secondary | ICD-10-CM

## 2016-02-11 DIAGNOSIS — Z01411 Encounter for gynecological examination (general) (routine) with abnormal findings: Secondary | ICD-10-CM

## 2016-02-11 DIAGNOSIS — R1904 Left lower quadrant abdominal swelling, mass and lump: Secondary | ICD-10-CM

## 2016-02-11 LAB — HEPATITIS C ANTIBODY: HCV Ab: NEGATIVE

## 2016-02-11 NOTE — Progress Notes (Signed)
65 y.o. [id number] MarriedCaucasianF here for annual exam.  Having some issues with abdominal discomfort.  Reports she feels she sometimes has an issue taking a deep breath.  Had a colonoscopy this year with Dr. Cristina Gong.  This was normal.  Pt continued to have symptoms and had chest x ray 8/17 with Dr. Inda Merlin and this was normal.  Just doesn't feel she's gotten to the bottom of it yet and "is a little worried".  Lots of stressors with mother who is having a lot of memory issues and also becoming angry.  Patient's last menstrual period was 11/26/2005.          Sexually active: Yes.    The current method of family planning is vasectomy.    Exercising: Yes.    walks 1/2 mile a day Smoker:  no  Health Maintenance: Pap:  08/30/13 negative, 2013 HR HPV negative  History of abnormal Pap:  yes MMG:  12/17/15 BIRADS 1 negative  Colonoscopy:  08/18/15 - [facility] - normal per patient BMD:   07/12/10 osteopenia  TDaP:  08/30/13  Pneumonia vaccine(s):  never Zostavax:   Would like to discuss today Hep C testing: discuss today Screening Labs: PCP, Hb today: PCP, Urine today: PCP   reports that she has never smoked. She has never used smokeless tobacco. She reports that she does not drink alcohol or use drugs.  Past Medical History:  Diagnosis Date  . 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 History of breast cancer   . IBS (irritable bowel syndrome)   . Osteopenia   . Shingles 2012    Past Surgical History:  Procedure Laterality Date  . BREAST EXCISIONAL BIOPSY  01/2007  . BREAST LUMPECTOMY  01/2007   right  . carpal tunel  1986   right  . CARPAL TUNNEL RELEASE     right  . COLONOSCOPY  07/2005    Current Outpatient Prescriptions  Medication Sig Dispense Refill  . acyclovir ointment (ZOVIRAX) 5 % Apply 1 application topically every 3 (three) hours. 15 g 1  . aspirin 81 MG tablet Take 81 mg by mouth 3 (three) times a week.     . Multiple Vitamin (MULTIVITAMIN PO) Take  0.5 tablets by mouth daily.     . Psyllium (METAMUCIL PO) Take 1 tablet by mouth 4 (four) times daily. caplets    . hyoscyamine (LEVSIN SL) 0.125 MG SL tablet as needed.     No current facility-administered medications for this visit.     Family History  Problem Relation Age of Onset  . Dementia Mother   . Transient ischemic attack Mother   . Prostate cancer Father   . Breast cancer Maternal Grandmother     ROS:  Pertinent items are noted in HPI.  Otherwise, a comprehensive ROS was negative.  Exam:   BP 116/64 (BP Location: Right Arm, Patient Position: Sitting, Cuff Size: Normal)   Pulse 80   Resp 16   Ht 5\' 4"  (1.626 m)   Wt 134 lb 3.2 oz (60.9 kg)   LMP 11/26/2005   BMI 23.04 kg/m   Weight change: -2#   Height: 5\' 4"  (162.6 cm)  Ht Readings from Last 3 Encounters:  02/11/16 5\' 4"  (1.626 m)  11/03/14 5' 4.75" (1.645 m)  08/30/13 5' 4.5" (1.638 m)    General appearance: alert, cooperative and appears stated age Head: Normocephalic, without obvious abnormality, atraumatic Neck: no adenopathy, supple, symmetrical, trachea midline and thyroid normal  to inspection and palpation Lungs: clear to auscultation bilaterally Breasts: bilateral breasts without masses, nipple discharge, LAD or skin changes. Heart: regular rate and rhythm Abdomen: soft, non-tender; bowel sounds normal; firmness all along the left side of her abdomen, no organomegaly Extremities: extremities normal, atraumatic, no cyanosis or edema Skin: Skin color, texture, turgor normal. No rashes or lesions Lymph nodes: Cervical, supraclavicular, and axillary nodes normal. No abnormal inguinal nodes palpated Neurologic: Grossly normal  Pelvic: External genitalia:  no lesions              Urethra:  normal appearing urethra with no masses, tenderness or lesions              Bartholins and Skenes: normal                 Vagina: normal appearing vagina with normal color and discharge, no lesions              Cervix:  no lesions              Pap taken: Yes.   Bimanual Exam:  Uterus:  normal size, contour, position, consistency, mobility, non-tender              Adnexa: normal adnexa and no mass, fullness, tenderness               Rectovaginal: Confirms               Anus:  normal sphincter tone, no lesions  Chaperone was present for exam.  A:  Well Woman with normal exam H/O uterine fibroids DCIS 11/08 Fever blisters H/O constipation with left sided firmness/mass Feeling of upper abdomen fullness Social stressors with her mother.  P: Mammogram--does diagnostic MMGs yearly Pap and HR HPV today Hep C antibody obtained today No RF for Zovirax needed.  Pt will call when she does need this. Will try to get CT approved return annually or prn  ~Additional 10 minutes spent with patient in discussion of abdominal symptoms.  This was in addition to well woman exam and care.

## 2016-02-15 LAB — IPS PAP TEST WITH HPV

## 2016-03-07 ENCOUNTER — Telehealth: Payer: Self-pay

## 2016-03-07 DIAGNOSIS — R1904 Left lower quadrant abdominal swelling, mass and lump: Secondary | ICD-10-CM

## 2016-03-07 DIAGNOSIS — R198 Other specified symptoms and signs involving the digestive system and abdomen: Secondary | ICD-10-CM

## 2016-03-07 DIAGNOSIS — Z8719 Personal history of other diseases of the digestive system: Secondary | ICD-10-CM

## 2016-03-07 NOTE — Telephone Encounter (Signed)
Order for CT Abdomen Pelvis with contrast ordered for precert prior to scheduling.  Routing to [place] for precert.

## 2016-03-08 NOTE — Telephone Encounter (Signed)
CT is scheduled for 03/11/16 with [facility].  In verifying benefits with insurance payor, prior approval is not required. Ok to close

## 2016-03-11 ENCOUNTER — Ambulatory Visit
Admission: RE | Admit: 2016-03-11 | Discharge: 2016-03-11 | Disposition: A | Discharge status: Home / Self Care | Source: Ambulatory Visit | Attending: Obstetrics & Gynecology | Admitting: Obstetrics & Gynecology

## 2016-03-11 DIAGNOSIS — Z8719 Personal history of other diseases of the digestive system: Secondary | ICD-10-CM

## 2016-03-11 DIAGNOSIS — R198 Other specified symptoms and signs involving the digestive system and abdomen: Secondary | ICD-10-CM

## 2016-03-11 DIAGNOSIS — R1904 Left lower quadrant abdominal swelling, mass and lump: Secondary | ICD-10-CM

## 2016-03-11 MED ORDER — IOPAMIDOL (ISOVUE-300) INJECTION 61%: 100.0000 mL | Freq: Once | INTRAVENOUS | Status: DC | PRN

## 2016-10-31 ENCOUNTER — Other Ambulatory Visit: Payer: Self-pay | Admitting: Obstetrics & Gynecology

## 2016-10-31 DIAGNOSIS — Z1231 Encounter for screening mammogram for malignant neoplasm of breast: Secondary | ICD-10-CM

## 2016-12-16 ENCOUNTER — Ambulatory Visit
Admission: RE | Admit: 2016-12-16 | Discharge: 2016-12-16 | Disposition: A | Discharge status: Home / Self Care | Source: Ambulatory Visit | Attending: Obstetrics & Gynecology | Admitting: Obstetrics & Gynecology

## 2016-12-16 DIAGNOSIS — Z1231 Encounter for screening mammogram for malignant neoplasm of breast: Secondary | ICD-10-CM

## 2017-06-01 ENCOUNTER — Encounter: Payer: Self-pay | Admitting: Obstetrics & Gynecology

## 2017-06-01 ENCOUNTER — Ambulatory Visit (INDEPENDENT_AMBULATORY_CARE_PROVIDER_SITE_OTHER): Admitting: Obstetrics & Gynecology

## 2017-06-01 ENCOUNTER — Other Ambulatory Visit: Payer: Self-pay

## 2017-06-01 VITALS — BP 128/60 | HR 88 | Resp 14 | Ht 64.75 in | Wt 137.8 lb

## 2017-06-01 DIAGNOSIS — M8589 Other specified disorders of bone density and structure, multiple sites: Secondary | ICD-10-CM | POA: Diagnosis not present

## 2017-06-01 DIAGNOSIS — Z01411 Encounter for gynecological examination (general) (routine) with abnormal findings: Secondary | ICD-10-CM | POA: Diagnosis not present

## 2017-06-01 MED ORDER — ACYCLOVIR 5 % EX OINT: 1.0000 "application " | TOPICAL_OINTMENT | CUTANEOUS | 1 refills | Status: DC

## 2017-06-01 NOTE — Progress Notes (Signed)
67 y.o. [id number] MarriedCaucasianF here for annual exam.  Doing well.  Still has some issues with epigastric pain.  Parents are really requiring a lot of extra care.  Her mother is in memory care.  Father is still in the family home.  Pt feels some of her issues are likely anxiety.    Denies vaginal bleeding.    Patient's last menstrual period was 11/26/2005.          Sexually active: Yes.    The current method of family planning is post menopausal status.    Exercising: Yes.    walking Smoker:  no  Health Maintenance: Pap:  02/11/16 Neg. HR HPV:neg   08/30/13 Neg  History of abnormal Pap:  Yes, ASCUS 2000 MMG:  12/16/16 BIRADS1:neg  Colonoscopy:  5/17 Normal  BMD:   07/12/10 Osteopenia.  No sure is she wants to do this again or note.  Considering.   TDaP:  08/2013 Pneumonia vaccine(s):  No Shingrix:   No.  D/w pt shinrix vaccination.  Considering. Hep C testing: 02/11/16 Neg  Screening Labs: PCP   reports that  has never smoked. she has never used smokeless tobacco. She reports that she does not drink alcohol or use drugs.  Past Medical History:  Diagnosis Date  . 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 History of breast cancer   . IBS (irritable bowel syndrome)   . Osteopenia   . Shingles 2012    Past Surgical History:  Procedure Laterality Date  . BREAST EXCISIONAL BIOPSY  01/2007  . BREAST LUMPECTOMY  01/2007   right  . carpal tunel  1986   right  . CARPAL TUNNEL RELEASE     right  . COLONOSCOPY  07/2005    Current Outpatient Medications  Medication Sig Dispense Refill  . acyclovir ointment (ZOVIRAX) 5 % Apply 1 application topically every 3 (three) hours. 15 g 1  . aspirin 81 MG tablet Take 81 mg by mouth 3 (three) times a week.     . fluorouracil (EFUDEX) 5 % cream daily as needed.    . hyoscyamine (LEVSIN SL) 0.125 MG SL tablet as needed.    . Multiple Vitamin (MULTIVITAMIN PO) Take 0.5 tablets by mouth daily.     . Psyllium (METAMUCIL  PO) Take 1 tablet by mouth 4 (four) times daily. caplets     No current facility-administered medications for this visit.     Family History  Problem Relation Age of Onset  . Dementia Mother   . Transient ischemic attack Mother   . Prostate cancer Father   . Breast cancer Maternal Grandmother     ROS:  Pertinent items are noted in HPI.  Otherwise, a comprehensive ROS was negative.  Exam:   BP 128/60 (BP Location: Right Arm, Patient Position: Sitting, Cuff Size: Normal)   Pulse 88   Resp 14   Ht 5' 4.75" (1.645 m)   Wt 137 lb 12.8 oz (62.5 kg)   LMP 11/26/2005   BMI 23.11 kg/m     Height: 5' 4.75" (164.5 cm)  Ht Readings from Last 3 Encounters:  06/01/17 5' 4.75" (1.645 m)  02/11/16 5\' 4"  (1.626 m)  11/03/14 5' 4.75" (1.645 m)    General appearance: alert, cooperative and appears stated age Head: Normocephalic, without obvious abnormality, atraumatic Neck: no adenopathy, supple, symmetrical, trachea midline and thyroid normal to inspection and palpation Lungs: clear to auscultation bilaterally Breasts: right lumpectomy scar, well healed, no LAD,  no nipple discharge; left breast without masses, no nipple discharge, no LAD Heart: regular rate and rhythm Abdomen: soft, non-tender; bowel sounds normal; no masses,  no organomegaly Extremities: extremities normal, atraumatic, no cyanosis or edema Skin: Skin color, texture, turgor normal. No rashes or lesions Lymph nodes: Cervical, supraclavicular, and axillary nodes normal. No abnormal inguinal nodes palpated Neurologic: Grossly normal   Pelvic: External genitalia:  no lesions              Urethra:  normal appearing urethra with no masses, tenderness or lesions              Bartholins and Skenes: normal                 Vagina: normal appearing vagina with normal color and discharge, no lesions              Cervix: no lesions              Pap taken: No. Bimanual Exam:  Uterus:  enlarged, 8 weeks size, globular               Adnexa: no mass, fullness, tenderness               Rectovaginal: Confirms               Anus:  normal sphincter tone, no lesions  Chaperone was present for exam.  A:  Well Woman with normal exam H/o uterine fibroids DCIS 11/08 Facial fever blisters Social stressors  P:   Mammogram guidelines reviewed.  Doing 3D. pap smear neg with neg HR HPV 11/17.  No pap smear obtained today Zovirax ointment to pharmacy Lab work with be done with Dr. Inda Merlin. Pt considering doing a BMD with her next MMG D/w pt shingrix vaccination.  She is considering this. return annually or prn

## 2017-10-03 ENCOUNTER — Other Ambulatory Visit: Payer: Self-pay | Admitting: Obstetrics & Gynecology

## 2017-10-03 DIAGNOSIS — Z1231 Encounter for screening mammogram for malignant neoplasm of breast: Secondary | ICD-10-CM

## 2017-12-18 ENCOUNTER — Ambulatory Visit

## 2017-12-20 ENCOUNTER — Ambulatory Visit
Admission: RE | Admit: 2017-12-20 | Discharge: 2017-12-20 | Disposition: A | Discharge status: Home / Self Care | Payer: Medicare Other | Source: Ambulatory Visit | Attending: Obstetrics & Gynecology | Admitting: Obstetrics & Gynecology

## 2017-12-20 DIAGNOSIS — Z1231 Encounter for screening mammogram for malignant neoplasm of breast: Secondary | ICD-10-CM

## 2017-12-20 HISTORY — DX: Personal history of irradiation: Z92.3

## 2018-06-12 ENCOUNTER — Ambulatory Visit: Admitting: Obstetrics & Gynecology

## 2018-08-07 ENCOUNTER — Ambulatory Visit: Admitting: Obstetrics & Gynecology

## 2018-08-10 ENCOUNTER — Other Ambulatory Visit: Payer: Self-pay | Admitting: Obstetrics & Gynecology

## 2018-08-10 DIAGNOSIS — Z1231 Encounter for screening mammogram for malignant neoplasm of breast: Secondary | ICD-10-CM

## 2018-08-16 ENCOUNTER — Other Ambulatory Visit: Payer: Self-pay

## 2018-08-21 ENCOUNTER — Other Ambulatory Visit: Payer: Self-pay

## 2018-08-21 ENCOUNTER — Encounter: Payer: Self-pay | Admitting: Obstetrics & Gynecology

## 2018-08-21 ENCOUNTER — Ambulatory Visit (INDEPENDENT_AMBULATORY_CARE_PROVIDER_SITE_OTHER): Payer: Medicare Other | Admitting: Obstetrics & Gynecology

## 2018-08-21 ENCOUNTER — Other Ambulatory Visit (HOSPITAL_COMMUNITY)
Admission: RE | Admit: 2018-08-21 | Discharge: 2018-08-21 | Disposition: A | Discharge status: Home / Self Care | Payer: Medicare Other | Source: Ambulatory Visit | Attending: Obstetrics & Gynecology | Admitting: Obstetrics & Gynecology

## 2018-08-21 VITALS — BP 144/80 | HR 84 | Temp 97.2°F | Ht 64.5 in | Wt 131.0 lb

## 2018-08-21 DIAGNOSIS — Z124 Encounter for screening for malignant neoplasm of cervix: Secondary | ICD-10-CM

## 2018-08-21 DIAGNOSIS — Z01411 Encounter for gynecological examination (general) (routine) with abnormal findings: Secondary | ICD-10-CM

## 2018-08-21 NOTE — Progress Notes (Signed)
68 y.o. G53P2002 Married White or Caucasian female here for annual exam.  Both parents passed last June.  Her 51 year old son is separated from his wife.  He is living with them.  They did not have children.    Denies vaginal bleeding.    PCP:  Dr. Inda Merlin.  Did have an appt scheduled for May.  This is rescheduled for October 02, 2018.    Patient's last menstrual period was 11/26/2005.          Sexually active: Yes.    The current method of family planning is post menopausal status.    Exercising: Yes.    walking, yard work  Smoker:  no  Health Maintenance: Pap:  02/11/16 Neg. HR HPV:neg  08/30/13 Neg  History of abnormal Pap:  Yes, ASCUS 2000 MMG:  12/20/17 BIRADS1:neg. Has appt 12/25/18  Colonoscopy:  2017 normal  BMD:   07/12/10 osteopenia  TDaP:  2015 Pneumonia vaccine(s):  No Shingrix:   No Hep C testing: 02/11/16 neg  Screening Labs: PCP   reports that she has never smoked. She has never used smokeless tobacco. She reports that she does not drink alcohol or use drugs.  Past Medical History:  Diagnosis Date  . 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 History of breast cancer   . IBS (irritable bowel syndrome)   . Osteopenia   . Personal history of radiation therapy   . Shingles 2012    Past Surgical History:  Procedure Laterality Date  . BREAST EXCISIONAL BIOPSY  01/2007  . BREAST LUMPECTOMY  01/2007   right  . carpal tunel  1986   right  . CARPAL TUNNEL RELEASE     right  . COLONOSCOPY  07/2005    Current Outpatient Medications  Medication Sig Dispense Refill  . acyclovir ointment (ZOVIRAX) 5 % Apply 1 application topically every 3 (three) hours. 15 g 1  . aspirin 81 MG tablet Take 81 mg by mouth 3 (three) times a week.     . fluorouracil (EFUDEX) 5 % cream daily as needed.    . hyoscyamine (LEVSIN SL) 0.125 MG SL tablet as needed.    . Multiple Vitamin (MULTIVITAMIN PO) Take 1 tablet by mouth daily.     . Psyllium (METAMUCIL PO) Take 2  tablets by mouth daily. caplets     No current facility-administered medications for this visit.     Family History  Problem Relation Age of Onset  . Dementia Mother   . Transient ischemic attack Mother   . Prostate cancer Father   . Breast cancer Maternal Grandmother     Review of Systems  All other systems reviewed and are negative.   Exam:   BP (!) 144/80   Pulse 84   Temp (!) 97.2 F (36.2 C) (Temporal)   Ht 5' 4.5" (1.638 m)   Wt 131 lb (59.4 kg)   LMP 11/26/2005   BMI 22.14 kg/m   Height: 5' 4.5" (163.8 cm)  Ht Readings from Last 3 Encounters:  08/21/18 5' 4.5" (1.638 m)  06/01/17 5' 4.75" (1.645 m)  02/11/16 5\' 4"  (1.626 m)    General appearance: alert, cooperative and appears stated age Head: Normocephalic, without obvious abnormality, atraumatic Neck: no adenopathy, supple, symmetrical, trachea midline and thyroid normal to inspection and palpation Lungs: clear to auscultation bilaterally Breasts: normal appearance, no masses or tenderness Heart: regular rate and rhythm Abdomen: soft, non-tender; bowel sounds normal; no masses,  no organomegaly Extremities: extremities normal, atraumatic, no cyanosis or edema Skin: Skin color, texture, turgor normal. No rashes or lesions Lymph nodes: Cervical, supraclavicular, and axillary nodes normal. No abnormal inguinal nodes palpated Neurologic: Grossly normal   Pelvic: External genitalia:  no lesions              Urethra:  normal appearing urethra with no masses, tenderness or lesions              Bartholins and Skenes: normal                 Vagina: normal appearing vagina with normal color and discharge, no lesions              Cervix: no lesions              Pap taken: Yes.   Bimanual Exam:  Uterus:  normal size, contour, position, consistency, mobility, non-tender              Adnexa: normal adnexa and no mass, fullness, tenderness               Rectovaginal: Confirms               Anus:  normal sphincter  tone, no lesions  Chaperone was present for exam.  A:  Well Woman with normal exam H/O uterine fibroids DCIS 11/08 Facial fever blisters Social stressors  P:   Mammogram guidelines reviewed pap smear obtained today Has appt with Dr. Inda Merlin for July and will do blood work at that visit Declines BMD Reviewed Shingrix vaccination today Will call if needs Zovirax Rx return annually or prn

## 2018-08-22 LAB — CYTOLOGY - PAP: Diagnosis: NEGATIVE

## 2018-12-25 ENCOUNTER — Other Ambulatory Visit: Payer: Self-pay

## 2018-12-25 ENCOUNTER — Ambulatory Visit
Admission: RE | Admit: 2018-12-25 | Discharge: 2018-12-25 | Disposition: A | Discharge status: Home / Self Care | Payer: Medicare Other | Source: Ambulatory Visit | Attending: Obstetrics & Gynecology | Admitting: Obstetrics & Gynecology

## 2018-12-25 DIAGNOSIS — Z1231 Encounter for screening mammogram for malignant neoplasm of breast: Secondary | ICD-10-CM

## 2019-04-22 ENCOUNTER — Ambulatory Visit: Payer: Medicare Other | Attending: Internal Medicine

## 2019-04-22 DIAGNOSIS — Z23 Encounter for immunization: Secondary | ICD-10-CM | POA: Insufficient documentation

## 2019-04-22 NOTE — Progress Notes (Signed)
   Covid-19 Vaccination Clinic  Name:  Robin Young    MRN: [id number] DOB: 1950/10/03  04/22/2019  Robin Young was observed post Covid-19 immunization for 15 minutes without incidence. She was provided with Vaccine Information Sheet and instruction to access the V-Safe system.   Robin Young was instructed to call 911 with any severe reactions post vaccine: Marland Kitchen Difficulty breathing  . Swelling of your face and throat  . A fast heartbeat  . A bad rash all over your body  . Dizziness and weakness    Immunizations Administered    Name Date Dose VIS Date Route   Pfizer COVID-19 Vaccine 04/22/2019  5:19 PM 0.3 mL 03/08/2019 Intramuscular   Manufacturer: [place]   Lot: [id number]   [place]: [id number]

## 2019-05-13 ENCOUNTER — Ambulatory Visit: Payer: Medicare Other | Attending: Internal Medicine

## 2019-05-13 DIAGNOSIS — Z23 Encounter for immunization: Secondary | ICD-10-CM | POA: Insufficient documentation

## 2019-05-13 NOTE — Progress Notes (Signed)
   Covid-19 Vaccination Clinic  Name:  NIAYLA DEWAN    MRN: [id number] DOB: [birth year]  05/13/2019  Ms. Totino was observed post Covid-19 immunization for 15 minutes without incidence. She was provided with Vaccine Information Sheet and instruction to access the V-Safe system.   Ms. Kadow was instructed to call 911 with any severe reactions post vaccine: Marland Kitchen Difficulty breathing  . Swelling of your face and throat  . A fast heartbeat  . A bad rash all over your body  . Dizziness and weakness    Immunizations Administered    Name Date Dose VIS Date Route   Pfizer COVID-19 Vaccine 05/13/2019 12:43 PM 0.3 mL 03/08/2019 Intramuscular   Manufacturer: [place]   Lot: [id number]   [place]: [id number]

## 2019-09-17 NOTE — Progress Notes (Signed)
69 y.o. G72P2002 Married White or Caucasian female here for annual exam.  Doing well.  Denies vaginal bleeding.  Son has divorced and this is all complete.    Patient's last menstrual period was 11/26/2005.          Sexually active: Yes.    The current method of family planning is post menopausal status.    Exercising: Yes.    walking Smoker:  no  Health Maintenance: Pap:  02-11-16 neg HPV HR neg, 08-21-2018 neg History of abnormal Pap:  yes MMG:  12-25-2018 category c density birads 1:neg (hx of breast cancer) Colonoscopy:  2017 normal BMD:   07-12-10 osteopenia TDaP:  2015 Pneumonia vaccine(s):  2020 Shingrix:   2020 Hep C testing: neg 2017 Screening Labs: Has appt in July with Dr. gates   reports that she has never smoked. She has never used smokeless tobacco. She reports that she does not drink alcohol and does not use drugs.  Past Medical History:  Diagnosis Date  . Abnormal Pap smear of cervix   . 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 History of breast cancer   . IBS (irritable bowel syndrome)   . Osteopenia   . Personal history of radiation therapy   . Shingles 2012    Past Surgical History:  Procedure Laterality Date  . BREAST EXCISIONAL BIOPSY  01/2007  . BREAST LUMPECTOMY  01/2007   right  . carpal tunel  1986   right  . CARPAL TUNNEL RELEASE     right  . COLONOSCOPY  07/2005    Current Outpatient Medications  Medication Sig Dispense Refill  . acyclovir ointment (ZOVIRAX) 5 % Apply 1 application topically every 3 (three) hours. 15 g 1  . aspirin 81 MG tablet Take 81 mg by mouth 3 (three) times a week.     . fluorouracil (EFUDEX) 5 % cream daily as needed.    . hyoscyamine (LEVSIN SL) 0.125 MG SL tablet as needed.    . loratadine (CLARITIN) 10 MG tablet Take 10 mg by mouth daily.    . Multiple Vitamin (MULTIVITAMIN PO) Take 1 tablet by mouth daily.     . Psyllium (METAMUCIL PO) Take 2 tablets by mouth daily. caplets     No current  facility-administered medications for this visit.    Family History  Problem Relation Age of Onset  . Dementia Mother   . Transient ischemic attack Mother   . Prostate cancer Father   . Breast cancer Maternal Grandmother     Review of Systems  Constitutional: Negative.   HENT: Negative.   Eyes: Negative.   Respiratory: Negative.   Cardiovascular: Negative.   Gastrointestinal: Negative.   Endocrine: Negative.   Genitourinary: Negative.   Musculoskeletal: Negative.   Skin: Negative.   Allergic/Immunologic: Negative.   Neurological: Negative.   Hematological: Negative.   Psychiatric/Behavioral: Negative.     Exam:   BP 122/64   Pulse 68   Temp (!) 97.2 F (36.2 C) (Skin)   Resp 16   Ht 5' 4.5" (1.638 m)   Wt 129 lb (58.5 kg)   LMP 11/26/2005   BMI 21.80 kg/m   Height: 5' 4.5" (163.8 cm)  General appearance: alert, cooperative and appears stated age Head: Normocephalic, without obvious abnormality, atraumatic Neck: no adenopathy, supple, symmetrical, trachea midline and thyroid normal to inspection and palpation Lungs: clear to auscultation bilaterally Breasts: normal appearance, no masses or tenderness, breast changes on right d/w radiation  changes Heart: regular rate and rhythm Abdomen: soft, non-tender; bowel sounds normal; no masses,  no organomegaly Extremities: extremities normal, atraumatic, no cyanosis or edema Skin: Skin color, texture, turgor normal. No rashes or lesions Lymph nodes: Cervical, supraclavicular, and axillary nodes normal. No abnormal inguinal nodes palpated Neurologic: Grossly normal   Pelvic: External genitalia:  no lesions              Urethra:  normal appearing urethra with no masses, tenderness or lesions              Bartholins and Skenes: normal                 Vagina: normal appearing vagina with normal color and discharge, no lesions              Cervix: no lesions              Pap taken: No. Bimanual Exam:  Uterus:  normal  size, contour, position, consistency, mobility, non-tender              Adnexa: normal adnexa and no mass, fullness, tenderness               Rectovaginal: Confirms               Anus:  normal sphincter tone, no lesions  Chaperone, Terence Lux, CMA, was present for exam.  A:  Well Woman with normal exam PMP, no HRT H/o uterine fibroids DCIS 11/08 Oral HSV  P:   Mammogram guidelines reviewed pap smear neg today, not indicated today Has appt and lab work planned in July Declines BMD testing Aware second pneumonia vaccination with Dr. Inda Merlin next month Colonoscopy UTD Zovirax 5% topically TID for up to 4 days.  #5oz/1RF Return annually or prn

## 2019-09-19 ENCOUNTER — Encounter: Payer: Self-pay | Admitting: Obstetrics & Gynecology

## 2019-09-19 ENCOUNTER — Ambulatory Visit (INDEPENDENT_AMBULATORY_CARE_PROVIDER_SITE_OTHER): Payer: Medicare Other | Admitting: Obstetrics & Gynecology

## 2019-09-19 ENCOUNTER — Other Ambulatory Visit: Payer: Self-pay

## 2019-09-19 VITALS — BP 122/64 | HR 68 | Temp 97.2°F | Resp 16 | Ht 64.5 in | Wt 129.0 lb

## 2019-09-19 DIAGNOSIS — Z853 Personal history of malignant neoplasm of breast: Secondary | ICD-10-CM

## 2019-09-19 DIAGNOSIS — Z124 Encounter for screening for malignant neoplasm of cervix: Secondary | ICD-10-CM

## 2019-09-19 DIAGNOSIS — Z01419 Encounter for gynecological examination (general) (routine) without abnormal findings: Secondary | ICD-10-CM | POA: Diagnosis not present

## 2019-09-19 MED ORDER — ACYCLOVIR 5 % EX OINT: 1.0000 "application " | TOPICAL_OINTMENT | CUTANEOUS | 1 refills | Status: AC

## 2019-10-18 ENCOUNTER — Other Ambulatory Visit: Payer: Self-pay | Admitting: Obstetrics & Gynecology

## 2019-10-18 DIAGNOSIS — Z1231 Encounter for screening mammogram for malignant neoplasm of breast: Secondary | ICD-10-CM

## 2019-12-26 ENCOUNTER — Ambulatory Visit: Payer: Medicare Other

## 2019-12-27 ENCOUNTER — Ambulatory Visit
Admission: RE | Admit: 2019-12-27 | Discharge: 2019-12-27 | Disposition: A | Discharge status: Home / Self Care | Payer: Medicare Other | Source: Ambulatory Visit | Attending: Obstetrics & Gynecology | Admitting: Obstetrics & Gynecology

## 2019-12-27 ENCOUNTER — Ambulatory Visit: Payer: Medicare Other

## 2019-12-27 ENCOUNTER — Other Ambulatory Visit: Payer: Self-pay

## 2019-12-27 DIAGNOSIS — Z1231 Encounter for screening mammogram for malignant neoplasm of breast: Secondary | ICD-10-CM

## 2020-01-03 ENCOUNTER — Encounter: Payer: Self-pay | Admitting: Obstetrics & Gynecology

## 2020-09-22 ENCOUNTER — Ambulatory Visit (HOSPITAL_BASED_OUTPATIENT_CLINIC_OR_DEPARTMENT_OTHER): Payer: Medicare Other | Admitting: Obstetrics & Gynecology

## 2020-11-09 ENCOUNTER — Other Ambulatory Visit: Payer: Self-pay | Admitting: Obstetrics & Gynecology

## 2020-11-09 DIAGNOSIS — Z1231 Encounter for screening mammogram for malignant neoplasm of breast: Secondary | ICD-10-CM

## 2020-11-13 ENCOUNTER — Ambulatory Visit: Payer: Medicare Other | Admitting: Obstetrics & Gynecology

## 2020-12-28 ENCOUNTER — Other Ambulatory Visit: Payer: Self-pay

## 2020-12-28 ENCOUNTER — Ambulatory Visit
Admission: RE | Admit: 2020-12-28 | Discharge: 2020-12-28 | Disposition: A | Discharge status: Home / Self Care | Payer: Medicare Other | Source: Ambulatory Visit | Attending: Obstetrics & Gynecology | Admitting: Obstetrics & Gynecology

## 2020-12-28 DIAGNOSIS — Z1231 Encounter for screening mammogram for malignant neoplasm of breast: Secondary | ICD-10-CM

## 2021-10-25 ENCOUNTER — Other Ambulatory Visit: Payer: Self-pay | Admitting: Family Medicine

## 2021-10-25 ENCOUNTER — Other Ambulatory Visit: Payer: Self-pay | Admitting: Obstetrics & Gynecology

## 2021-10-25 DIAGNOSIS — Z1231 Encounter for screening mammogram for malignant neoplasm of breast: Secondary | ICD-10-CM

## 2021-12-29 ENCOUNTER — Ambulatory Visit
Admission: RE | Admit: 2021-12-29 | Discharge: 2021-12-29 | Disposition: A | Discharge status: Home / Self Care | Payer: Medicare Other | Source: Ambulatory Visit | Attending: Obstetrics & Gynecology | Admitting: Obstetrics & Gynecology

## 2021-12-29 DIAGNOSIS — Z1231 Encounter for screening mammogram for malignant neoplasm of breast: Secondary | ICD-10-CM

## 2022-01-26 ENCOUNTER — Other Ambulatory Visit: Payer: Self-pay | Admitting: Urology

## 2022-02-03 NOTE — Patient Instructions (Signed)
DUE TO COVID-19 ONLY TWO VISITORS  (aged 69 and older)  ARE ALLOWED TO COME WITH YOU AND STAY IN THE WAITING ROOM ONLY DURING PRE OP AND PROCEDURE.   **NO VISITORS ARE ALLOWED IN THE SHORT STAY AREA OR RECOVERY ROOM!!**  IF YOU WILL BE ADMITTED INTO THE HOSPITAL YOU ARE ALLOWED ONLY FOUR SUPPORT PEOPLE DURING VISITATION HOURS ONLY (7 AM -8PM)   The support person(s) must pass our screening, gel in and out, and wear a mask at all times, including in the patient's room. Patients must also wear a mask when staff or their support person are in the room. Visitors GUEST BADGE MUST BE WORN VISIBLY  One adult visitor may remain with you overnight and MUST be in the room by 8 P.M.     Your procedure is scheduled on: 02/10/22   Report to [facility] Main [facility]tr[facility]    Report to admitting at : 7:00 AM   Call this number if you have problems the morning of surgery [phone]   Do not eat food :After Midnight.   After Midnight you may have the following liquids until : 6:00 AM DAY OF SURGERY  Water Black Coffee (sugar ok, NO MILK/CREAM OR CREAMERS)  Tea (sugar ok, NO MILK/CREAM OR CREAMERS) regular and decaf                             Plain Jell-O (NO RED)                                           Fruit ices (not with fruit pulp, NO RED)                                     Popsicles (NO RED)                                                                  Juice: apple, WHITE grape, WHITE cranberry Sports drinks like Gatorade (NO RED)               Oral Hygiene is also important to reduce your risk of infection.                                    Remember - BRUSH YOUR TEETH THE MORNING OF SURGERY WITH YOUR REGULAR TOOTHPASTE   Do NOT smoke after Midnight   Take these medicines the morning of surgery with A SIP OF WATER: loratadine.                              You may not have any metal on your body including hair pins, jewelry, and body piercing             Do not wear make-up,  lotions, powders, perfumes/cologne, or deodorant  Do not wear nail polish including gel and S&S, artificial/acrylic nails, or any other type of covering  on natural nails including finger and toenails. If you have artificial nails, gel coating, etc. that needs to be removed by a nail salon please have this removed prior to surgery or surgery may need to be canceled/ delayed if the surgeon/ anesthesia feels like they are unable to be safely monitored.   Do not shave  48 hours prior to surgery.    Do not bring valuables to the hospital. [place].   Contacts, dentures or bridgework may not be worn into surgery.   Bring small overnight bag day of surgery.   DO NOT [place]. PHARMACY WILL DISPENSE MEDICATIONS LISTED ON YOUR MEDICATION LIST TO YOU DURING YOUR ADMISSION [place]!    Patients discharged on the day of surgery will not be allowed to drive home.  Someone NEEDS to stay with you for the first 24 hours after anesthesia.   Special Instructions: Bring a copy of your healthcare power of attorney and living will documents         the day of surgery if you haven't scanned them before.              Please read over the following fact sheets you were given: IF YOU HAVE QUESTIONS ABOUT YOUR PRE-OP INSTRUCTIONS PLEASE CALL [phone]    [facility] - Preparing for Surgery Before surgery, you can play an important role.  Because skin is not sterile, your skin needs to be as free of germs as possible.  You can reduce the number of germs on your skin by washing with CHG (chlorahexidine gluconate) soap before surgery.  CHG is an antiseptic cleaner which kills germs and bonds with the skin to continue killing germs even after washing. Please DO NOT use if you have an allergy to CHG or antibacterial soaps.  If your skin becomes reddened/irritated stop using the CHG and inform your nurse when you arrive at Short  Stay. Do not shave (including legs and underarms) for at least 48 hours prior to the first CHG shower.  You may shave your face/neck. Please follow these instructions carefully:  1.  Shower with CHG Soap the night before surgery and the  morning of Surgery.  2.  If you choose to wash your hair, wash your hair first as usual with your  normal  shampoo.  3.  After you shampoo, rinse your hair and body thoroughly to remove the  shampoo.                           4.  Use CHG as you would any other liquid soap.  You can apply chg directly  to the skin and wash                       Gently with a scrungie or clean washcloth.  5.  Apply the CHG Soap to your body ONLY FROM THE NECK DOWN.   Do not use on face/ open                           Wound or open sores. Avoid contact with eyes, ears mouth and genitals (private parts).  Wash face,  Genitals (private parts) with your normal soap.             6.  Wash thoroughly, paying special attention to the area where your surgery  will be performed.  7.  Thoroughly rinse your body with warm water from the neck down.  8.  DO NOT shower/wash with your normal soap after using and rinsing off  the CHG Soap.                9.  Pat yourself dry with a clean towel.            10.  Wear clean pajamas.            11.  Place clean sheets on your bed the night of your first shower and do not  sleep with pets. Day of Surgery : Do not apply any lotions/deodorants the morning of surgery.  Please wear clean clothes to the hospital/surgery center.  FAILURE TO FOLLOW THESE INSTRUCTIONS MAY RESULT IN THE CANCELLATION OF YOUR SURGERY PATIENT SIGNATURE_________________________________  NURSE SIGNATURE__________________________________  ________________________________________________________________________

## 2022-02-04 ENCOUNTER — Other Ambulatory Visit: Payer: Self-pay

## 2022-02-04 ENCOUNTER — Encounter (HOSPITAL_COMMUNITY): Payer: Self-pay

## 2022-02-04 ENCOUNTER — Encounter (HOSPITAL_COMMUNITY)
Admission: RE | Admit: 2022-02-04 | Discharge: 2022-02-04 | Disposition: A | Discharge status: Home / Self Care | Payer: Medicare Other | Source: Ambulatory Visit | Attending: Urology | Admitting: Urology

## 2022-02-04 VITALS — HR 72 | Temp 98.2°F | Ht 64.5 in | Wt 135.0 lb

## 2022-02-04 DIAGNOSIS — Z01812 Encounter for preprocedural laboratory examination: Secondary | ICD-10-CM | POA: Diagnosis present

## 2022-02-04 DIAGNOSIS — Z01818 Encounter for other preprocedural examination: Secondary | ICD-10-CM

## 2022-02-04 HISTORY — DX: Personal history of urinary calculi: Z87.442

## 2022-02-04 HISTORY — DX: Chronic kidney disease, unspecified: N18.9

## 2022-02-04 LAB — CBC
HCT: 37.5 % (ref 36.0–46.0)
Hemoglobin: 12 g/dL (ref 12.0–15.0)
MCH: 31.7 pg (ref 26.0–34.0)
MCHC: 32 g/dL (ref 30.0–36.0)
MCV: 98.9 fL (ref 80.0–100.0)
Platelets: 222 10*3/uL (ref 150–400)
RBC: 3.79 MIL/uL — ABNORMAL LOW (ref 3.87–5.11)
RDW: 13 % (ref 11.5–15.5)
WBC: 4.8 10*3/uL (ref 4.0–10.5)
nRBC: 0 % (ref 0.0–0.2)

## 2022-02-04 NOTE — Progress Notes (Signed)
For Short Stay: [place] appointment date:  Bowel Prep reminder:   For Anesthesia: PCP - Minette Brine [place]: PA Cardiologist -   Chest x-ray -  EKG -  Stress Test -  ECHO -  Cardiac Cath -  Pacemaker/ICD device last checked: Pacemaker orders received: Device Rep notified:  Spinal Cord Stimulator:  Sleep Study -  CPAP -   Fasting Blood Sugar -  Checks Blood Sugar _____ times a day Date and result of last Hgb A1c-  Last dose of GLP1 agonist-  GLP1 instructions:   Last dose of SGLT-2 inhibitors-  SGLT-2 instructions:   Blood Thinner Instructions: Aspirin Instructions: Last Dose:  Activity level: Can go up a flight of stairs and activities of daily living without stopping and without chest pain and/or shortness of breath   Able to exercise without chest pain and/or shortness of breath   Unable to go up a flight of stairs without chest pain and/or shortness of breath     Anesthesia review:   Patient denies shortness of breath, fever, cough and chest pain at PAT appointment   Patient verbalized understanding of instructions that were given to them at the PAT appointment. Patient was also instructed that they will need to review over the PAT instructions again at home before surgery.

## 2022-02-09 NOTE — H&P (Signed)
Office Visit Report     01/11/2022   --------------------------------------------------------------------------------   Robin Young  MRN: [id number]  DOB: 08-17-1950, 71 year old Female  SSN: -[id number]   PRIMARY CARE:    REFERRING:  Thayer Ohm, PA  PROVIDER:  Raynelle Bring, M.D.  LOCATION:  [facility] 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 She was in her normal state of health until approximately 2 months ago when she developed some urinary urgency and noted gross hematuria on July 26. This persisted and on July 30, she thought she might of passed a stone. She was evaluated by her primary care physician on August 3 and had a negative urine culture. However, she has persisted in having intermittent painless gross hematuria since then. She was further evaluated by her primary care office and again had a negative urine culture but had persistent hematuria. A renal and bladder ultrasound was performed on 01/07/2022 which indicated no hydronephrosis or renal masses but did demonstrate a questionable right distal ureteral mass. She has not had any hematuria today. She denies flank pain or fever. She denies nausea or vomiting. She denies a history of tobacco use.     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   NON-GU PMH: Breast Cancer, History    FAMILY HISTORY: No Family History    SOCIAL HISTORY: Marital Status: Married Current Smoking Status: Patient has never smoked.   Tobacco Use  Assessment Completed: Used Tobacco in last 30 days?    REVIEW OF SYSTEMS:    GU Review Female:   Patient denies frequent urination, hard to postpone urination, burning /pain with urination, get up at night to urinate, leakage of urine, stream starts and stops, trouble starting your stream, have to strain to urinate, and currently pregnant.  Gastrointestinal (Lower):   Patient denies diarrhea and constipation.  Gastrointestinal (Upper):   Patient denies nausea and vomiting.  Constitutional:   Patient denies fever, night sweats, weight loss, and fatigue.  Skin:   Patient denies skin rash/ lesion and itching.  Eyes:   Patient denies blurred vision and double vision.  Ears/ Nose/ Throat:   Patient denies sore throat and sinus problems.  Hematologic/Lymphatic:   Patient denies swollen glands and easy bruising.  Cardiovascular:   Patient denies leg swelling and chest pains.  Respiratory:   Patient denies cough and shortness of breath.  Endocrine:   Patient denies excessive thirst.  Musculoskeletal:   Patient denies back pain and joint pain.  Neurological:   Patient denies headaches and dizziness.  Psychologic:   Patient denies depression and anxiety.   VITAL SIGNS:      01/11/2022 03:56 PM  Weight 130 lb / 58.97 kg  Height 65 in / 165.1 cm  BP 146/70 mmHg  Pulse 92 /min  Temperature 97.7 F / 36.5 C  BMI 21.6 kg/m   MULTI-SYSTEM PHYSICAL EXAMINATION:    Constitutional: Well-nourished. No physical deformities. Normally developed. Good grooming.  Neck: Neck symmetrical, not swollen. Normal tracheal position.  Respiratory: No labored breathing,  no use of accessory muscles. Clear bilaterally.  Cardiovascular: Normal temperature, normal extremity pulses, no swelling, no varicosities. Regular rate and rhythm.  Lymphatic: No enlargement of neck, axillae, groin.  Skin: No paleness, no jaundice, no cyanosis. No lesion, no ulcer, no rash.  Neurologic / Psychiatric: Oriented to time, oriented to  place, oriented to person. No depression, no anxiety, no agitation.  Gastrointestinal: No mass, no tenderness, no rigidity, non obese abdomen. No CVA tenderness.  Eyes: Normal conjunctivae. Normal eyelids.  Ears, Nose, Mouth, and Throat: Left ear no scars, no lesions, no masses. Right ear no scars, no lesions, no masses. Nose no scars, no lesions, no masses. Normal hearing. Normal lips.  Musculoskeletal: Normal gait and station of head and neck.     Complexity of Data:  Records Review:   Previous Patient Records  X-Ray Review: Renal Ultrasound: Reviewed Films.     PROCEDURES:          Urinalysis - 81003 Dipstick Dipstick Cont'd  Specimen: Voided Bilirubin: Neg  Color: Yellow Ketones: Neg  Appearance: Clear Blood: Neg  Specific Gravity: <=1.005 Protein: Neg  pH: 5.5 Urobilinogen: 0.2  Glucose: Neg Nitrites: Neg    Leukocyte Esterase: Neg    Notes:      ASSESSMENT:      ICD-10 Details  1 GU:   Gross hematuria - R31.0    PLAN:           Orders Labs BUN/Creatinine  X-Rays: C.T. Hematuria With and Without I.V. Contrast          Schedule Return Visit/Planned Activity: Other See Visit Notes             Note: Will call with results of CT scan.          Document Letter(s):  Created for Patient: Clinical Summary         Notes:   1. 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 Considering the findings on her ultrasound, we will begin with a hematuria protocol CT scan. If this does confirm a concerning right ureteral mass, we will then proceed to the operating room for cystoscopy in the OR along with right ureteroscopy and biopsy, etc. Further recommendations will be pending her CT results. We have reviewed the potential risks, complications, and expected recovery process associated with this procedure. She does give informed consent today.   CC: Thayer Ohm, PA-C    * Signed by Raynelle Bring, M.D. on  01/11/22 at 5:07 PM (EDT)*     APPENDED NOTES:    I reviewed her CT scan results with her indicating a bladder tumor vs right distal ureteral tumor. Will proceed with cystoscopy, right RPG, right ureteroscopy, right ureteral stent placement, and transurethral resection of tumor. We have reviewed the procedure in detail including the potential risks, complications, and expected outcomes.    * Signed by Raynelle Bring, M.D. on 01/24/22 at 11:24 AM (EDT)*

## 2022-02-10 ENCOUNTER — Ambulatory Visit (HOSPITAL_COMMUNITY)
Admission: RE | Admit: 2022-02-10 | Discharge: 2022-02-10 | Disposition: A | Discharge status: Home / Self Care | Payer: Medicare Other | Attending: Urology | Admitting: Urology

## 2022-02-10 ENCOUNTER — Encounter (HOSPITAL_COMMUNITY): Payer: Self-pay | Admitting: Urology

## 2022-02-10 ENCOUNTER — Ambulatory Visit (HOSPITAL_BASED_OUTPATIENT_CLINIC_OR_DEPARTMENT_OTHER): Payer: Medicare Other | Admitting: Anesthesiology

## 2022-02-10 ENCOUNTER — Encounter (HOSPITAL_COMMUNITY)
Admission: RE | Disposition: A | Discharge status: Home / Self Care | Payer: Self-pay | Source: Home / Self Care | Attending: Urology

## 2022-02-10 ENCOUNTER — Ambulatory Visit (HOSPITAL_COMMUNITY): Payer: Medicare Other | Admitting: Anesthesiology

## 2022-02-10 ENCOUNTER — Ambulatory Visit (HOSPITAL_COMMUNITY): Payer: Medicare Other

## 2022-02-10 ENCOUNTER — Other Ambulatory Visit: Payer: Self-pay

## 2022-02-10 DIAGNOSIS — R310 Gross hematuria: Secondary | ICD-10-CM | POA: Diagnosis not present

## 2022-02-10 DIAGNOSIS — C679 Malignant neoplasm of bladder, unspecified: Secondary | ICD-10-CM | POA: Insufficient documentation

## 2022-02-10 DIAGNOSIS — D494 Neoplasm of unspecified behavior of bladder: Secondary | ICD-10-CM | POA: Diagnosis not present

## 2022-02-10 HISTORY — PX: CYSTOSCOPY WITH RETROGRADE PYELOGRAM, URETEROSCOPY AND STENT PLACEMENT: SHX5789

## 2022-02-10 HISTORY — PX: TRANSURETHRAL RESECTION OF BLADDER TUMOR: SHX2575

## 2022-02-10 LAB — POCT I-STAT, CHEM 8
BUN: 16 mg/dL (ref 8–23)
Calcium, Ion: 1.16 mmol/L (ref 1.15–1.40)
Chloride: 105 mmol/L (ref 98–111)
Creatinine, Ser: 1.1 mg/dL — ABNORMAL HIGH (ref 0.44–1.00)
Glucose, Bld: 118 mg/dL — ABNORMAL HIGH (ref 70–99)
HCT: 36 % (ref 36.0–46.0)
Hemoglobin: 12.2 g/dL (ref 12.0–15.0)
Potassium: 4.9 mmol/L (ref 3.5–5.1)
Sodium: 140 mmol/L (ref 135–145)
TCO2: 26 mmol/L (ref 22–32)

## 2022-02-10 SURGERY — CYSTOURETEROSCOPY, WITH RETROGRADE PYELOGRAM AND STENT INSERTION
Anesthesia: General | Site: Bladder | Laterality: Right

## 2022-02-10 MED ORDER — FENTANYL CITRATE (PF) 100 MCG/2ML IJ SOLN
INTRAMUSCULAR | Status: DC | PRN
  Administered 2022-02-10 (×2): 50 ug via INTRAVENOUS

## 2022-02-10 MED ORDER — ONDANSETRON HCL 4 MG/2ML IJ SOLN
INTRAMUSCULAR | Status: DC | PRN
  Administered 2022-02-10: 4 mg via INTRAVENOUS

## 2022-02-10 MED ORDER — ORAL CARE MOUTH RINSE: 15.0000 mL | Freq: Once | OROMUCOSAL | Status: AC

## 2022-02-10 MED ORDER — FENTANYL CITRATE (PF) 100 MCG/2ML IJ SOLN
INTRAMUSCULAR | Status: AC
  Filled 2022-02-10: qty 2

## 2022-02-10 MED ORDER — OXYCODONE HCL 5 MG/5ML PO SOLN: 5.0000 mg | Freq: Once | ORAL | Status: DC | PRN

## 2022-02-10 MED ORDER — ACETAMINOPHEN 500 MG PO TABS
1000.0000 mg | ORAL_TABLET | Freq: Once | ORAL | Status: AC
  Administered 2022-02-10: 1000 mg via ORAL
  Filled 2022-02-10: qty 2

## 2022-02-10 MED ORDER — SODIUM CHLORIDE 0.9 % IR SOLN
Status: DC | PRN
  Administered 2022-02-10: 3000 mL

## 2022-02-10 MED ORDER — DEXAMETHASONE SODIUM PHOSPHATE 10 MG/ML IJ SOLN
INTRAMUSCULAR | Status: AC
  Filled 2022-02-10: qty 1

## 2022-02-10 MED ORDER — PROPOFOL 10 MG/ML IV BOLUS
INTRAVENOUS | Status: DC | PRN
  Administered 2022-02-10: 120 mg via INTRAVENOUS

## 2022-02-10 MED ORDER — DEXAMETHASONE SODIUM PHOSPHATE 10 MG/ML IJ SOLN
INTRAMUSCULAR | Status: DC | PRN
  Administered 2022-02-10: 8 mg via INTRAVENOUS

## 2022-02-10 MED ORDER — CHLORHEXIDINE GLUCONATE 0.12 % MT SOLN
15.0000 mL | Freq: Once | OROMUCOSAL | Status: AC
  Administered 2022-02-10: 15 mL via OROMUCOSAL

## 2022-02-10 MED ORDER — IOHEXOL 300 MG/ML  SOLN
INTRAMUSCULAR | Status: DC | PRN
  Administered 2022-02-10: 50 mL via URETHRAL

## 2022-02-10 MED ORDER — MIDAZOLAM HCL 2 MG/2ML IJ SOLN
INTRAMUSCULAR | Status: DC | PRN
  Administered 2022-02-10: 2 mg via INTRAVENOUS

## 2022-02-10 MED ORDER — LIDOCAINE HCL (CARDIAC) PF 100 MG/5ML IV SOSY
PREFILLED_SYRINGE | INTRAVENOUS | Status: DC | PRN
  Administered 2022-02-10: 60 mg via INTRATRACHEAL

## 2022-02-10 MED ORDER — FENTANYL CITRATE PF 50 MCG/ML IJ SOSY: 25.0000 ug | PREFILLED_SYRINGE | INTRAMUSCULAR | Status: DC | PRN

## 2022-02-10 MED ORDER — ONDANSETRON HCL 4 MG/2ML IJ SOLN
INTRAMUSCULAR | Status: AC
  Filled 2022-02-10: qty 2

## 2022-02-10 MED ORDER — MIDAZOLAM HCL 2 MG/2ML IJ SOLN
INTRAMUSCULAR | Status: AC
  Filled 2022-02-10: qty 2

## 2022-02-10 MED ORDER — ONDANSETRON HCL 4 MG/2ML IJ SOLN: 4.0000 mg | Freq: Once | INTRAMUSCULAR | Status: DC | PRN

## 2022-02-10 MED ORDER — EPHEDRINE SULFATE (PRESSORS) 50 MG/ML IJ SOLN
INTRAMUSCULAR | Status: DC | PRN
  Administered 2022-02-10: 5 mg via INTRAVENOUS

## 2022-02-10 MED ORDER — CEFAZOLIN SODIUM-DEXTROSE 2-4 GM/100ML-% IV SOLN
2.0000 g | INTRAVENOUS | Status: AC
  Administered 2022-02-10: 2 g via INTRAVENOUS
  Filled 2022-02-10: qty 100

## 2022-02-10 MED ORDER — OXYCODONE HCL 5 MG PO TABS: 5.0000 mg | ORAL_TABLET | Freq: Once | ORAL | Status: DC | PRN

## 2022-02-10 MED ORDER — PHENAZOPYRIDINE HCL 200 MG PO TABS: 200.0000 mg | ORAL_TABLET | Freq: Three times a day (TID) | ORAL | 0 refills | Status: AC | PRN

## 2022-02-10 MED ORDER — LIDOCAINE HCL (PF) 2 % IJ SOLN
INTRAMUSCULAR | Status: AC
  Filled 2022-02-10: qty 5

## 2022-02-10 MED ORDER — AMISULPRIDE (ANTIEMETIC) 5 MG/2ML IV SOLN: 10.0000 mg | Freq: Once | INTRAVENOUS | Status: DC | PRN

## 2022-02-10 MED ORDER — PHENYLEPHRINE 80 MCG/ML (10ML) SYRINGE FOR IV PUSH (FOR BLOOD PRESSURE SUPPORT)
PREFILLED_SYRINGE | INTRAVENOUS | Status: AC
  Filled 2022-02-10: qty 30

## 2022-02-10 MED ORDER — LACTATED RINGERS IV SOLN: INTRAVENOUS | Status: DC

## 2022-02-10 SURGICAL SUPPLY — 29 items
BAG COUNTER SPONGE SURGICOUNT (BAG) IMPLANT
BAG URINE DRAIN 2000ML AR STRL (UROLOGICAL SUPPLIES) IMPLANT
BAG URO CATCHER STRL LF (MISCELLANEOUS) ×2 IMPLANT
BASKET ZERO TIP NITINOL 2.4FR (BASKET) IMPLANT
CATH URETL OPEN END 6FR 70 (CATHETERS) IMPLANT
CLOTH BEACON ORANGE TIMEOUT ST (SAFETY) ×2 IMPLANT
DRAPE FOOT SWITCH (DRAPES) ×2 IMPLANT
ELECT REM PT RETURN 15FT ADLT (MISCELLANEOUS) ×2 IMPLANT
GLOVE SURG LX STRL 7.5 STRW (GLOVE) ×2 IMPLANT
GOWN STRL REUS W/ TWL XL LVL3 (GOWN DISPOSABLE) ×2 IMPLANT
GOWN STRL REUS W/TWL XL LVL3 (GOWN DISPOSABLE) ×2
GUIDEWIRE STR DUAL SENSOR (WIRE) ×2 IMPLANT
GUIDEWIRE ZIPWRE .038 STRAIGHT (WIRE) IMPLANT
IV NS 1000ML (IV SOLUTION) ×2
IV NS 1000ML BAXH (IV SOLUTION) ×2 IMPLANT
KIT TURNOVER KIT A (KITS) IMPLANT
LASER FIB FLEXIVA PULSE ID 365 (Laser) IMPLANT
LOOP CUT BIPOLAR 24F LRG (ELECTROSURGICAL) IMPLANT
MANIFOLD NEPTUNE II (INSTRUMENTS) ×2 IMPLANT
PACK CYSTO (CUSTOM PROCEDURE TRAY) ×2 IMPLANT
PENCIL SMOKE EVACUATOR (MISCELLANEOUS) IMPLANT
SHEATH NAVIGATOR HD 12/14X36 (SHEATH) IMPLANT
STENT URET 6FRX24 CONTOUR (STENTS) IMPLANT
SYR TOOMEY IRRIG 70ML (MISCELLANEOUS) ×2
SYRINGE TOOMEY IRRIG 70ML (MISCELLANEOUS) IMPLANT
TRACTIP FLEXIVA PULS ID 200XHI (Laser) IMPLANT
TRACTIP FLEXIVA PULSE ID 200 (Laser)
TUBING CONNECTING 10 (TUBING) ×2 IMPLANT
TUBING UROLOGY SET (TUBING) ×2 IMPLANT

## 2022-02-10 NOTE — Anesthesia Postprocedure Evaluation (Signed)
Anesthesia Post Note  Patient: Robin Young  Procedure(s) Performed: CYSTOSCOPY WITH RIGHT RETROGRADE PYELOGRAM, RIGHT STENT PLACEMENT (Right: Bladder) TRANSURETHRAL RESECTION OF BLADDER TUMOR (TURBT)     Patient location during evaluation: PACU Anesthesia Type: General Level of consciousness: awake and alert Pain management: pain level controlled Vital Signs Assessment: post-procedure vital signs reviewed and stable Respiratory status: spontaneous breathing, nonlabored ventilation and respiratory function stable Cardiovascular status: blood pressure returned to baseline and stable Postop Assessment: no apparent nausea or vomiting Anesthetic complications: no   No notable events documented.  Last Vitals:  Vitals:   02/10/22 1030 02/10/22 1045  BP: (!) 147/69 (!) 141/63  Pulse: 65 60  Resp: 15 13  Temp:    SpO2: 100% 99%    Last Pain:  Vitals:   02/10/22 1045  TempSrc:   PainSc: 0-No pain                 Lidia Collum

## 2022-02-10 NOTE — Transfer of Care (Signed)
Immediate Anesthesia Transfer of Care Note  Patient: Robin Young  Procedure(s) Performed: CYSTOSCOPY WITH RIGHT RETROGRADE PYELOGRAM, RIGHT STENT PLACEMENT (Right: Bladder) TRANSURETHRAL RESECTION OF BLADDER TUMOR (TURBT)  Patient Location: PACU  Anesthesia Type:General  Level of Consciousness: awake, drowsy, and patient cooperative  Airway & Oxygen Therapy: Patient connected to face mask oxygen  Post-op Assessment: Report given to RN and Post -op Vital signs reviewed and stable  Post vital signs: stable  Last Vitals:  Vitals Value Taken Time  BP    Temp    Pulse 66 02/10/22 1019  Resp 12 02/10/22 1019  SpO2 100 % 02/10/22 1019  Vitals shown include unvalidated device data.  Last Pain:  Vitals:   02/10/22 0755  TempSrc:   PainSc: 0-No pain         Complications: No notable events documented.

## 2022-02-10 NOTE — Op Note (Signed)
Preoperative diagnosis: Right ureteral vs bladder tumor   Postoperative diagnosis:  Bladder tumor (2 cm)  Procedure:  Cystoscopy Transurethral resection of bladder tumor (2 cm) Right retrograde pyelography with interpretation Right ureteral stent placement (6 x 24-no string) Bladder washing for cytology Pelvic exam under anesthesia  Surgeon: Pryor Curia. M.D.  Anesthesia: General  Complications: None  Intraoperative findings:  Bladder tumor: She was found to have papillary 2 cm right-sided bladder tumor just lateral to her right ureteral orifice. Retrograde pyelography: Right retrograde pyelography was performed with a 6 French ureteral catheter and Omnipaque contrast.  This demonstrated normal caliber ureter without filling defects.  Normal right renal collecting system without hydronephrosis or filling defects.  EBL: Minimal  Specimens: Bladder washing for cytology Right-sided bladder tumor Base of bladder tumor  Disposition of specimens: Pathology  Indication: Robin Young is a patient who was found to have a bladder vs distal right ureteral tumor. After reviewing the management options for treatment, he elected to proceed with the above surgical procedure(s). We have discussed the potential benefits and risks of the procedure, side effects of the proposed treatment, the likelihood of the patient achieving the goals of the procedure, and any potential problems that might occur during the procedure or recuperation. Informed consent has been obtained.  Description of procedure:  The patient was taken to the operating room and general anesthesia was induced.  The patient was placed in the dorsal lithotomy position, prepped and draped in the usual sterile fashion, and preoperative antibiotics were administered. A preoperative time-out was performed.   Cystourethroscopy was performed.  The patient's urethra was examined and was normal.  Systematic examination of  bladder revealed a 2 cm papillary right-sided bladder tumor just lateral to the right ureteral orifice.  No other abnormalities were noted.  A bladder washing was obtained for cytology.  A 6 French ureteral catheter was then used to intubate the right ureteral orifice.  Omnipaque contrast was injected with no abnormalities noted as noted above.  Attention then returned to the bladder.  The cystoscope was removed and replaced with a 26 French resectoscope.  The patient's bladder tumor was then resected with bipolar cutting loop resection.  The majority of the bladder tumor was resected and sent as the primary specimen.  Additional resection was then performed of the base of the tumor and sent as a separate specimen.  Hemostasis was then achieved with the loop cautery and the bladder was emptied and reinspected with no further bleeding noted.  Due to the proximity of the resection to the right ureter, it was felt the right ureteral stent placement was indicated.  Therefore a 0.38 sensor guidewire was advanced up the right ureter into the right renal collecting system under fluoroscopic guidance.  A 6 x 24 double-J ureteral stent was then advanced over the wire using Seldinger technique and positioned under fluoroscopic and cystoscopic surveillance.  The wire was removed with a good curl noted in the renal pelvis as well as within the bladder.  The bladder was then emptied and the procedure ended.  Pelvic exam was performed.  No pelvic mass was noted.  The patient appeared to tolerate the procedure well and without complications.  The patient was able to be awakened and transferred to the recovery unit in satisfactory condition.    Pryor Curia MD

## 2022-02-10 NOTE — Discharge Instructions (Signed)

## 2022-02-10 NOTE — Anesthesia Preprocedure Evaluation (Signed)
Anesthesia Evaluation  Patient identified by MRN, date of birth, ID band Patient awake    Reviewed: Allergy & Precautions, NPO status , Patient's Chart, lab work & pertinent test results  History of Anesthesia Complications Negative for: history of anesthetic complications  Airway Mallampati: II  TM Distance: >3 FB Neck ROM: Full    Dental  (+) Teeth Intact, Dental Advisory Given   Pulmonary neg pulmonary ROS   Pulmonary exam normal        Cardiovascular negative cardio ROS Normal cardiovascular exam     Neuro/Psych negative neurological ROS     GI/Hepatic negative GI ROS, Neg liver ROS,,,  Endo/Other  negative endocrine ROS    Renal/GU Renal disease   R ureteral vs. Bladder tumor    Musculoskeletal negative musculoskeletal ROS (+)    Abdominal   Peds  Hematology negative hematology ROS (+)   Anesthesia Other Findings H/o breast cancer  Reproductive/Obstetrics                             Anesthesia Physical Anesthesia Plan  ASA: 2  Anesthesia Plan: General   Post-op Pain Management: Tylenol PO (pre-op)*   Induction: Intravenous  PONV Risk Score and Plan: 3 and Ondansetron, Dexamethasone, Midazolam and Treatment may vary due to age or medical condition  Airway Management Planned: LMA  Additional Equipment: None  Intra-op Plan:   Post-operative Plan: Extubation in OR  Informed Consent: I have reviewed the patients History and Physical, chart, labs and discussed the procedure including the risks, benefits and alternatives for the proposed anesthesia with the patient or authorized representative who has indicated his/her understanding and acceptance.     Dental advisory given  Plan Discussed with:   Anesthesia Plan Comments:         Anesthesia Quick Evaluation

## 2022-02-10 NOTE — Progress Notes (Signed)
0810 Dr. Christella Hartigan in to evaluate asked  that a I stat be performed manager made aware. 2956 I stat done

## 2022-02-10 NOTE — Interval H&P Note (Signed)
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  The various methods of treatment have been discussed with the patient and family. After consideration of risks, benefits and other options for treatment, the patient has consented to  Procedure(s): CYSTOSCOPY WITH RIGHT RETROGRADE PYELOGRAM, RIGHT URETEROSCOPY AND RIGHT STENT PLACEMENT (Right) TRANSURETHRAL RESECTION OF BLADDER TUMOR (TURBT) (N/A) as a surgical intervention.  The patient's history has been reviewed, patient examined, no change in status, stable for surgery.  I have reviewed the patient's chart and labs.  Questions were answered to the patient's satisfaction.     [employer]

## 2022-02-10 NOTE — Anesthesia Procedure Notes (Signed)
Procedure Name: LMA Insertion Date/Time: 02/10/2022 9:30 AM  Performed by: Pilar Grammes, CRNAPre-anesthesia Checklist: Patient identified, Emergency Drugs available, Suction available, Patient being monitored and Timeout performed Patient Re-evaluated:Patient Re-evaluated prior to induction Oxygen Delivery Method: Circle system utilized Preoxygenation: Pre-oxygenation with 100% oxygen Induction Type: IV induction Ventilation: Mask ventilation without difficulty LMA: LMA inserted LMA Size: 4.0 Number of attempts: 1 Tube secured with: Tape

## 2022-02-11 ENCOUNTER — Encounter (HOSPITAL_COMMUNITY): Payer: Self-pay | Admitting: Urology

## 2022-02-11 LAB — CYTOLOGY - NON PAP

## 2022-02-11 LAB — SURGICAL PATHOLOGY

## 2022-02-24 ENCOUNTER — Other Ambulatory Visit: Payer: Self-pay | Admitting: Urology

## 2022-03-01 NOTE — Progress Notes (Signed)
COVID Vaccine Completed: yes  Date of COVID positive in last 90 days:  PCP - Thayer Ohm, PA Cardiologist -   Chest x-ray -  EKG -  Stress Test -  ECHO -  Cardiac Cath -  Pacemaker/ICD device last checked: Spinal Cord Stimulator:  Bowel Prep -   Sleep Study -  CPAP -   Fasting Blood Sugar -  Checks Blood Sugar _____ times a day  Last dose of GLP1 agonist-  N/A GLP1 instructions:  N/A   Last dose of SGLT-2 inhibitors-  N/A SGLT-2 instructions: N/A   Blood Thinner Instructions: Aspirin Instructions: Last Dose:  Activity level:  Can go up a flight of stairs and perform activities of daily living without stopping and without symptoms of chest pain or shortness of breath.  Able to exercise without symptoms  Unable to go up a flight of stairs without symptoms of     Anesthesia review:   Patient denies shortness of breath, fever, cough and chest pain at PAT appointment  Patient verbalized understanding of instructions that were given to them at the PAT appointment. Patient was also instructed that they will need to review over the PAT instructions again at home before surgery.

## 2022-03-01 NOTE — Patient Instructions (Signed)
SURGICAL WAITING ROOM VISITATION Patients having surgery or a procedure may have no more than 2 support people in the waiting area - these visitors may rotate.   Children under the age of 50 must have an adult with them who is not the patient. If the patient needs to stay at the hospital during part of their recovery, the visitor guidelines for inpatient rooms apply. Pre-op nurse will coordinate an appropriate time for 1 support person to accompany patient in pre-op.  This support person may not rotate.    Please refer to the [facility] website for the visitor guidelines for Inpatients (after your surgery is over and you are in a regular room).    Your procedure is scheduled on: 03/07/22   Report to [facility] Main Entrance    Report to admitting at 11:00 AM   Call this number if you have problems the morning of surgery [phone]   Do not eat food  or drink liquids :After Midnight.          If you have questions, please contact your surgeon's office.   FOLLOW BOWEL PREP AND ANY ADDITIONAL PRE OP INSTRUCTIONS YOU RECEIVED FROM YOUR SURGEON'S OFFICE!!!     Oral Hygiene is also important to reduce your risk of infection.                                    Remember - BRUSH YOUR TEETH THE MORNING OF SURGERY WITH YOUR REGULAR TOOTHPASTE  DENTURES WILL BE REMOVED PRIOR TO SURGERY PLEASE DO NOT APPLY "Poly grip" OR ADHESIVES!!!   Take these medicines the morning of surgery with A SIP OF WATER: Claritin                               You may not have any metal on your body including hair pins, jewelry, and body piercing             Do not wear make-up, lotions, powders, perfumes, or deodorant  Do not wear nail polish including gel and S&S, artificial/acrylic nails, or any other type of covering on natural nails including finger and toenails. If you have artificial nails, gel coating, etc. that needs to be removed by a nail salon please have this removed prior to surgery or  surgery may need to be canceled/ delayed if the surgeon/ anesthesia feels like they are unable to be safely monitored.   Do not shave  48 hours prior to surgery.    Do not bring valuables to the hospital. [place].  DO NOT [place]. PHARMACY WILL DISPENSE MEDICATIONS LISTED ON YOUR MEDICATION LIST TO YOU DURING YOUR ADMISSION [place]!    Patients discharged on the day of surgery will not be allowed to drive home.  Someone NEEDS to stay with you for the first 24 hours after anesthesia.   Special Instructions: Bring a copy of your healthcare power of attorney and living will documents the day of surgery if you haven't scanned them before.              Please read over the following fact sheets you were given: IF YOU HAVE QUESTIONS ABOUT YOUR PRE-OP INSTRUCTIONS PLEASE CALL [phone]- Apolonio Schneiders  If you received a COVID test during your pre-op visit  it is requested that you wear a mask when out in public, stay away from anyone that may not be feeling well and notify your surgeon if you develop symptoms. If you test positive for Covid or have been in contact with anyone that has tested positive in the last 10 days please notify you surgeon.    [facility] - Preparing for Surgery Before surgery, you can play an important role.  Because skin is not sterile, your skin needs to be as free of germs as possible.  You can reduce the number of germs on your skin by washing with CHG (chlorahexidine gluconate) soap before surgery.  CHG is an antiseptic cleaner which kills germs and bonds with the skin to continue killing germs even after washing. Please DO NOT use if you have an allergy to CHG or antibacterial soaps.  If your skin becomes reddened/irritated stop using the CHG and inform your nurse when you arrive at Short Stay. Do not shave (including legs and underarms) for at least 48 hours prior to the first CHG  shower.  You may shave your face/neck.  Please follow these instructions carefully:  1.  Shower with CHG Soap the night before surgery and the  morning of surgery.  2.  If you choose to wash your hair, wash your hair first as usual with your normal  shampoo.  3.  After you shampoo, rinse your hair and body thoroughly to remove the shampoo.                             4.  Use CHG as you would any other liquid soap.  You can apply chg directly to the skin and wash.  Gently with a scrungie or clean washcloth.  5.  Apply the CHG Soap to your body ONLY FROM THE NECK DOWN.   Do   not use on face/ open                           Wound or open sores. Avoid contact with eyes, ears mouth and   genitals (private parts).                       Wash face,  Genitals (private parts) with your normal soap.             6.  Wash thoroughly, paying special attention to the area where your    surgery  will be performed.  7.  Thoroughly rinse your body with warm water from the neck down.  8.  DO NOT shower/wash with your normal soap after using and rinsing off the CHG Soap.                9.  Pat yourself dry with a clean towel.            10.  Wear clean pajamas.            11.  Place clean sheets on your bed the night of your first shower and do not  sleep with pets. Day of Surgery : Do not apply any lotions/deodorants the morning of surgery.  Please wear clean clothes to the hospital/surgery center.  FAILURE TO FOLLOW THESE INSTRUCTIONS MAY RESULT IN THE CANCELLATION OF YOUR SURGERY  PATIENT SIGNATURE_________________________________  NURSE SIGNATURE__________________________________  ________________________________________________________________________

## 2022-03-02 ENCOUNTER — Encounter (HOSPITAL_COMMUNITY)
Admission: RE | Admit: 2022-03-02 | Discharge: 2022-03-02 | Disposition: A | Discharge status: Home / Self Care | Payer: Medicare Other | Source: Ambulatory Visit | Attending: Urology | Admitting: Urology

## 2022-03-02 ENCOUNTER — Encounter (HOSPITAL_COMMUNITY): Payer: Self-pay

## 2022-03-02 VITALS — BP 135/77 | HR 84 | Temp 98.7°F | Resp 12 | Ht 64.0 in | Wt 133.0 lb

## 2022-03-02 DIAGNOSIS — C679 Malignant neoplasm of bladder, unspecified: Secondary | ICD-10-CM | POA: Insufficient documentation

## 2022-03-02 DIAGNOSIS — Z01812 Encounter for preprocedural laboratory examination: Secondary | ICD-10-CM | POA: Diagnosis present

## 2022-03-02 DIAGNOSIS — Z01818 Encounter for other preprocedural examination: Secondary | ICD-10-CM

## 2022-03-02 LAB — CBC
HCT: 36.8 % (ref 36.0–46.0)
Hemoglobin: 11.7 g/dL — ABNORMAL LOW (ref 12.0–15.0)
MCH: 31.5 pg (ref 26.0–34.0)
MCHC: 31.8 g/dL (ref 30.0–36.0)
MCV: 99.2 fL (ref 80.0–100.0)
Platelets: 264 10*3/uL (ref 150–400)
RBC: 3.71 MIL/uL — ABNORMAL LOW (ref 3.87–5.11)
RDW: 12.5 % (ref 11.5–15.5)
WBC: 4.9 10*3/uL (ref 4.0–10.5)
nRBC: 0 % (ref 0.0–0.2)

## 2022-03-04 NOTE — H&P (Signed)
Office Visit Report     02/22/2022   --------------------------------------------------------------------------------   Robin Young  MRN: [id number]  DOB: Jul 09, 1950, 71 year old Female  SSN: [id number]   PRIMARY CARE:     REFERRING:  Luvenia Redden  PROVIDER:  Raynelle Bring, M.D.  LOCATION:  [facility] [phone]     --------------------------------------------------------------------------------   CC/HPI: Bladder cancer   Robin Young presents today after undergoing transurethral resection of the bladder tumor located just adjacent to her right ureteral orifice a few weeks ago. She has recovered as expected. She still has significant urinary frequency and urgency and intermittent hematuria related to her indwelling stent which was placed at the time of her resection due to the proximity of her tumor to the right ureteral orifice. She presents today to review her pathology which demonstrated a high-grade, T1 urothelial carcinoma. Muscle was present in the specimen and was uninvolved.     ALLERGIES: No Known Drug Allergies    MEDICATIONS: Claritin  Multivitamin     GU PSH: Cystoscopy Insert Stent - 02/10/2022 Cystoscopy TURBT 2-5 cm - 02/10/2022 Locm 300-'399Mg'$ /Ml Iodine,1Ml - 01/20/2022     NON-GU PSH: Carpal tunnel surgery     GU PMH: Gross hematuria - 01/20/2022, - 01/11/2022      PMH Notes:   1) Bladder cancer: She was initially diagnosed with bladder cancer in November 2023 after presenting with gross hematuria.   Nov 2023: TURBT - High grade, T1, urothelial carcinoma (muscle present and uninvolved)     NON-GU PMH: Breast Cancer, History    FAMILY HISTORY: No Family History    SOCIAL HISTORY: Marital Status: Married Current Smoking Status: Patient has never smoked.   Tobacco Use Assessment Completed: Used Tobacco in last 30 days?    REVIEW OF SYSTEMS:    GU Review Female:   Patient denies frequent urination, hard to postpone  urination, burning /pain with urination, get up at night to urinate, leakage of urine, stream starts and stops, trouble starting your stream, have to strain to urinate, and currently pregnant.  Gastrointestinal (Lower):   Patient denies diarrhea and constipation.  Gastrointestinal (Upper):   Patient denies nausea and vomiting.  Constitutional:   Patient denies fever, night sweats, weight loss, and fatigue.  Skin:   Patient denies skin rash/ lesion and itching.  Eyes:   Patient denies blurred vision and double vision.  Ears/ Nose/ Throat:   Patient denies sore throat and sinus problems.  Hematologic/Lymphatic:   Patient denies swollen glands and easy bruising.  Cardiovascular:   Patient denies leg swelling and chest pains.  Respiratory:   Patient denies cough and shortness of breath.  Endocrine:   Patient denies excessive thirst.  Musculoskeletal:   Patient denies back pain and joint pain.  Neurological:   Patient denies headaches and dizziness.  Psychologic:   Patient denies depression and anxiety.   VITAL SIGNS:      02/22/2022 08:53 AM  Weight 132 lb / 59.87 kg  Height 65 in / 165.1 cm  BP 137/72 mmHg  Pulse 87 /min  Temperature 97.3 F / 36.2 C  BMI 22.0 kg/m   MULTI-SYSTEM PHYSICAL EXAMINATION:    Constitutional: Well-nourished. No physical deformities. Normally developed. Good grooming.     Complexity of Data:  Records Review:   Pathology Reports, Previous Patient Records   PROCEDURES:          Urinalysis w/Scope Dipstick Dipstick Cont'd Micro  Color: Yellow Bilirubin: Neg mg/dL WBC/hpf: 0 -  5/hpf  Appearance: Clear Ketones: Neg mg/dL RBC/hpf: 3 - 10/hpf  Specific Gravity: 1.010 Blood: 3+ ery/uL Bacteria: Rare (0-9/hpf)  pH: 6.0 Protein: 1+ mg/dL Cystals: Ca Oxalate  Glucose: Neg mg/dL Urobilinogen: 0.2 mg/dL Casts: NS (Not Seen)    Nitrites: Neg Trichomonas: Not Present    Leukocyte Esterase: 1+ leu/uL Mucous: Not Present      Epithelial Cells: 0 - 5/hpf      Yeast:  NS (Not Seen)      Sperm: Not Present    ASSESSMENT:      ICD-10 Details  1 GU:   Bladder Cancer overlapping sites - C67.8    PLAN:           Schedule Return Visit/Planned Activity: Other See Visit Notes             Note: Will call to schedule surgery          Document Letter(s):  Created for Patient: Clinical Summary         Notes:   1. High-grade, T1 urothelial carcinoma of the bladder: We reviewed her pathology report in detail. I have recommended that we proceed with a restaging transurethral resection in the near future. We briefly discussed intravesical therapy with BCG as the likely next step in treatment if her staging is confirmed. We reviewed the potential risks and side effects of that therapy. All questions were answered to her stated satisfaction. She will be scheduled for cystoscopy and repeat transurethral resection in the near future. I will plan to leave her indwelling stent until then and plan to remove it the day of that procedure.   CC: Robin Ohm, PA-C    * Signed by Raynelle Bring, M.D. on 02/22/22 at 12:02 PM (EST)*

## 2022-03-07 ENCOUNTER — Ambulatory Visit (HOSPITAL_COMMUNITY): Payer: Medicare Other | Admitting: Certified Registered"

## 2022-03-07 ENCOUNTER — Encounter (HOSPITAL_COMMUNITY): Payer: Self-pay | Admitting: Urology

## 2022-03-07 ENCOUNTER — Ambulatory Visit (HOSPITAL_BASED_OUTPATIENT_CLINIC_OR_DEPARTMENT_OTHER): Payer: Medicare Other | Admitting: Certified Registered"

## 2022-03-07 ENCOUNTER — Encounter (HOSPITAL_COMMUNITY)
Admission: RE | Disposition: A | Discharge status: Home / Self Care | Payer: Self-pay | Source: Home / Self Care | Attending: Urology

## 2022-03-07 ENCOUNTER — Ambulatory Visit (HOSPITAL_COMMUNITY)
Admission: RE | Admit: 2022-03-07 | Discharge: 2022-03-07 | Disposition: A | Discharge status: Home / Self Care | Payer: Medicare Other | Attending: Urology | Admitting: Urology

## 2022-03-07 DIAGNOSIS — R319 Hematuria, unspecified: Secondary | ICD-10-CM | POA: Diagnosis not present

## 2022-03-07 DIAGNOSIS — Z923 Personal history of irradiation: Secondary | ICD-10-CM | POA: Insufficient documentation

## 2022-03-07 DIAGNOSIS — Z853 Personal history of malignant neoplasm of breast: Secondary | ICD-10-CM | POA: Insufficient documentation

## 2022-03-07 DIAGNOSIS — R350 Frequency of micturition: Secondary | ICD-10-CM | POA: Insufficient documentation

## 2022-03-07 DIAGNOSIS — R3915 Urgency of urination: Secondary | ICD-10-CM | POA: Insufficient documentation

## 2022-03-07 DIAGNOSIS — C679 Malignant neoplasm of bladder, unspecified: Secondary | ICD-10-CM

## 2022-03-07 DIAGNOSIS — C678 Malignant neoplasm of overlapping sites of bladder: Secondary | ICD-10-CM | POA: Diagnosis present

## 2022-03-07 HISTORY — PX: CYSTOSCOPY WITH STENT PLACEMENT: SHX5790

## 2022-03-07 HISTORY — PX: TRANSURETHRAL RESECTION OF BLADDER TUMOR: SHX2575

## 2022-03-07 SURGERY — TURBT (TRANSURETHRAL RESECTION OF BLADDER TUMOR)
Anesthesia: General | Site: Ureter

## 2022-03-07 MED ORDER — ONDANSETRON HCL 4 MG/2ML IJ SOLN
INTRAMUSCULAR | Status: DC | PRN
  Administered 2022-03-07: 4 mg via INTRAVENOUS

## 2022-03-07 MED ORDER — LIDOCAINE 2% (20 MG/ML) 5 ML SYRINGE
INTRAMUSCULAR | Status: DC | PRN
  Administered 2022-03-07: 40 mg via INTRAVENOUS

## 2022-03-07 MED ORDER — MIDAZOLAM HCL 2 MG/2ML IJ SOLN: 0.5000 mg | Freq: Once | INTRAMUSCULAR | Status: DC | PRN

## 2022-03-07 MED ORDER — FENTANYL CITRATE (PF) 100 MCG/2ML IJ SOLN
INTRAMUSCULAR | Status: AC
  Filled 2022-03-07: qty 2

## 2022-03-07 MED ORDER — MIDAZOLAM HCL 5 MG/5ML IJ SOLN
INTRAMUSCULAR | Status: DC | PRN
  Administered 2022-03-07: 2 mg via INTRAVENOUS

## 2022-03-07 MED ORDER — ROCURONIUM BROMIDE 100 MG/10ML IV SOLN
INTRAVENOUS | Status: DC | PRN
  Administered 2022-03-07: 40 mg via INTRAVENOUS

## 2022-03-07 MED ORDER — ONDANSETRON HCL 4 MG/2ML IJ SOLN
INTRAMUSCULAR | Status: AC
  Filled 2022-03-07: qty 2

## 2022-03-07 MED ORDER — CEFAZOLIN SODIUM-DEXTROSE 2-4 GM/100ML-% IV SOLN
2.0000 g | INTRAVENOUS | Status: AC
  Administered 2022-03-07: 2 g via INTRAVENOUS
  Filled 2022-03-07: qty 100

## 2022-03-07 MED ORDER — FENTANYL CITRATE (PF) 100 MCG/2ML IJ SOLN
INTRAMUSCULAR | Status: DC | PRN
  Administered 2022-03-07 (×2): 25 ug via INTRAVENOUS
  Administered 2022-03-07: 50 ug via INTRAVENOUS

## 2022-03-07 MED ORDER — MEPERIDINE HCL 50 MG/ML IJ SOLN: 6.2500 mg | INTRAMUSCULAR | Status: DC | PRN

## 2022-03-07 MED ORDER — SODIUM CHLORIDE 0.9 % IR SOLN
Status: DC | PRN
  Administered 2022-03-07: 6000 mL

## 2022-03-07 MED ORDER — DEXAMETHASONE SODIUM PHOSPHATE 10 MG/ML IJ SOLN
INTRAMUSCULAR | Status: DC | PRN
  Administered 2022-03-07: 8 mg via INTRAVENOUS

## 2022-03-07 MED ORDER — 0.9 % SODIUM CHLORIDE (POUR BTL) OPTIME
TOPICAL | Status: DC | PRN
  Administered 2022-03-07: 1000 mL

## 2022-03-07 MED ORDER — OXYCODONE HCL 5 MG/5ML PO SOLN: 5.0000 mg | Freq: Once | ORAL | Status: DC | PRN

## 2022-03-07 MED ORDER — OXYCODONE HCL 5 MG PO TABS: 5.0000 mg | ORAL_TABLET | Freq: Once | ORAL | Status: DC | PRN

## 2022-03-07 MED ORDER — LIDOCAINE HCL (PF) 2 % IJ SOLN
INTRAMUSCULAR | Status: AC
  Filled 2022-03-07: qty 5

## 2022-03-07 MED ORDER — ROCURONIUM BROMIDE 10 MG/ML (PF) SYRINGE
PREFILLED_SYRINGE | INTRAVENOUS | Status: AC
  Filled 2022-03-07: qty 10

## 2022-03-07 MED ORDER — CHLORHEXIDINE GLUCONATE 0.12 % MT SOLN
15.0000 mL | Freq: Once | OROMUCOSAL | Status: AC
  Administered 2022-03-07: 15 mL via OROMUCOSAL

## 2022-03-07 MED ORDER — SUGAMMADEX SODIUM 200 MG/2ML IV SOLN
INTRAVENOUS | Status: DC | PRN
  Administered 2022-03-07: 250 mg via INTRAVENOUS

## 2022-03-07 MED ORDER — ACETAMINOPHEN 500 MG PO TABS: 1000.0000 mg | ORAL_TABLET | Freq: Once | ORAL | Status: DC

## 2022-03-07 MED ORDER — ORAL CARE MOUTH RINSE: 15.0000 mL | Freq: Once | OROMUCOSAL | Status: AC

## 2022-03-07 MED ORDER — MIDAZOLAM HCL 2 MG/2ML IJ SOLN
INTRAMUSCULAR | Status: AC
  Filled 2022-03-07: qty 2

## 2022-03-07 MED ORDER — LACTATED RINGERS IV SOLN: INTRAVENOUS | Status: DC

## 2022-03-07 MED ORDER — PROPOFOL 10 MG/ML IV BOLUS
INTRAVENOUS | Status: DC | PRN
  Administered 2022-03-07: 100 mg via INTRAVENOUS

## 2022-03-07 MED ORDER — FENTANYL CITRATE PF 50 MCG/ML IJ SOSY: 25.0000 ug | PREFILLED_SYRINGE | INTRAMUSCULAR | Status: DC | PRN

## 2022-03-07 MED ORDER — PROMETHAZINE HCL 25 MG/ML IJ SOLN: 6.2500 mg | INTRAMUSCULAR | Status: DC | PRN

## 2022-03-07 SURGICAL SUPPLY — 20 items
BAG URINE DRAIN 2000ML AR STRL (UROLOGICAL SUPPLIES) IMPLANT
BAG URO CATCHER STRL LF (MISCELLANEOUS) ×2 IMPLANT
CATH URETL OPEN END 6FR 70 (CATHETERS) IMPLANT
CLOTH BEACON ORANGE TIMEOUT ST (SAFETY) ×2 IMPLANT
DRAPE FOOT SWITCH (DRAPES) ×2 IMPLANT
ELECT REM PT RETURN 15FT ADLT (MISCELLANEOUS) ×2 IMPLANT
GLOVE SURG LX STRL 7.5 STRW (GLOVE) ×2 IMPLANT
GOWN STRL REUS W/ TWL XL LVL3 (GOWN DISPOSABLE) ×2 IMPLANT
GOWN STRL REUS W/TWL XL LVL3 (GOWN DISPOSABLE) ×2
GUIDEWIRE STR DUAL SENSOR (WIRE) ×2 IMPLANT
GUIDEWIRE ZIPWRE .038 STRAIGHT (WIRE) IMPLANT
KIT TURNOVER KIT A (KITS) IMPLANT
LOOP CUT BIPOLAR 24F LRG (ELECTROSURGICAL) IMPLANT
MANIFOLD NEPTUNE II (INSTRUMENTS) ×2 IMPLANT
PACK CYSTO (CUSTOM PROCEDURE TRAY) ×2 IMPLANT
PENCIL SMOKE EVACUATOR (MISCELLANEOUS) IMPLANT
SYR TOOMEY IRRIG 70ML (MISCELLANEOUS)
SYRINGE TOOMEY IRRIG 70ML (MISCELLANEOUS) IMPLANT
TUBING CONNECTING 10 (TUBING) ×2 IMPLANT
TUBING UROLOGY SET (TUBING) ×2 IMPLANT

## 2022-03-07 NOTE — Discharge Instructions (Signed)
You may see some blood in the urine and may have some burning with urination for 48-72 hours. You also may notice that you have to urinate more frequently or urgently after your procedure which is normal.  You should call should you develop an inability urinate, fever > 101, persistent nausea and vomiting that prevents you from eating or drinking to stay hydrated.    

## 2022-03-07 NOTE — Op Note (Signed)
Preoperative diagnosis: High-grade, T1 urothelial carcinoma of the bladder  Postoperative diagnosis: High-grade, T1 urothelial carcinoma of the bladder  Procedure: 1.  Cystoscopy 2.  Restaging transurethral resection of bladder tumor (2.5 cm) 3.  Right ureteral stent removal  Surgeon: Pryor Curia MD  Anesthesia: General  Complications: None  EBL: Minimal  Specimen: Restaging transurethral resection of bladder tumor  Disposition of specimen: Pathology  Indication: Ms. Robin Young is a 71 year old female who was recently found to have a bladder tumor status post transurethral resection.  This demonstrated high-grade, T1 urothelial carcinoma the bladder.  Muscle was present in the initial specimen and was negative for malignancy.  She returns today for a restaging transurethral resection procedure.  The potential risks, complications, and expected recovery process was discussed in detail.  Informed consent was obtained.  Description of procedure: The patient was taken to the operating room and a general anesthetic was administered.  Preoperative antibiotics were given and he was placed in the dorsolithotomy position.  A preoperative timeout was performed.  Cystourethroscopy was performed which demonstrated the patient's indwelling right ureteral stent.  The prior resection site was noted just lateral to the right ureteral orifice.  No remaining viable tumor was visualized.  The previously resected site was reresected in its entirety utilizing the 17 French resectoscope with bipolar cutting loop.  Specimen was removed and passed off for permanent pathologic analysis.  Hemostasis was achieved with bipolar cautery.  The bladder was emptied and reinspected and hemostasis appeared excellent.  The indwelling ureteral stent was then removed with the bipolar loop.  The patient tolerated the procedure well without complications.  She was able to be awakened and transferred to the recovery unit in  satisfactory condition.

## 2022-03-07 NOTE — Anesthesia Preprocedure Evaluation (Addendum)
Anesthesia Evaluation  Patient identified by MRN, date of birth, ID band Patient awake    Reviewed: Allergy & Precautions, NPO status , Patient's Chart, lab work & pertinent test results  History of Anesthesia Complications Negative for: history of anesthetic complications  Airway Mallampati: II  TM Distance: >3 FB Neck ROM: Full    Dental  (+) Chipped, Dental Advisory Given   Pulmonary neg pulmonary ROS   breath sounds clear to auscultation       Cardiovascular (-) angina negative cardio ROS  Rhythm:Regular Rate:Normal     Neuro/Psych negative neurological ROS     GI/Hepatic negative GI ROS, Neg liver ROS,,,  Endo/Other  negative endocrine ROS    Renal/GU negative Renal ROS   Bladder tumor    Musculoskeletal   Abdominal   Peds  Hematology negative hematology ROS (+)   Anesthesia Other Findings H/o breast cancer: XRT  Reproductive/Obstetrics                             Anesthesia Physical Anesthesia Plan  ASA: 2  Anesthesia Plan: General   Post-op Pain Management: Tylenol PO (pre-op)*   Induction: Intravenous  PONV Risk Score and Plan: 3 and Ondansetron, Dexamethasone, Treatment may vary due to age or medical condition and Midazolam  Airway Management Planned: LMA  Additional Equipment: None  Intra-op Plan:   Post-operative Plan:   Informed Consent: I have reviewed the patients History and Physical, chart, labs and discussed the procedure including the risks, benefits and alternatives for the proposed anesthesia with the patient or authorized representative who has indicated his/her understanding and acceptance.     Dental advisory given  Plan Discussed with: CRNA and Surgeon  Anesthesia Plan Comments:         Anesthesia Quick Evaluation

## 2022-03-07 NOTE — Interval H&P Note (Signed)
History and Physical Interval Note:  03/07/2022 1:07 PM  Robin Young  has presented today for surgery, with the diagnosis of BLADDER CANCER.  The various methods of treatment have been discussed with the patient and family. After consideration of risks, benefits and other options for treatment, the patient has consented to  Procedure(s) with comments: TRANSURETHRAL RESECTION OF BLADDER TUMOR (TURBT) (N/A) - 60 MINUTES NEEDED FOR CASE  ANESTHESIA IS GENERAL WITH PARALYSIS CYSTOSCOPY WITH STENT REMOVAL (N/A) as a surgical intervention.  The patient's history has been reviewed, patient examined, no change in status, stable for surgery.  I have reviewed the patient's chart and labs.  Questions were answered to the patient's satisfaction.     [employer]

## 2022-03-07 NOTE — Anesthesia Postprocedure Evaluation (Signed)
Anesthesia Post Note  Patient: [name]  Procedure(s) Performed: TRANSURETHRAL RESECTION OF BLADDER TUMOR (TURBT) (Bladder) CYSTOSCOPY WITH STENT REMOVAL (Ureter)     Patient location during evaluation: PACU Anesthesia Type: General Level of consciousness: awake and alert, patient cooperative and oriented Pain management: pain level controlled Vital Signs Assessment: post-procedure vital signs reviewed and stable Respiratory status: spontaneous breathing, nonlabored ventilation and respiratory function stable Cardiovascular status: blood pressure returned to baseline and stable Postop Assessment: no apparent nausea or vomiting Anesthetic complications: no   No notable events documented.  Last Vitals:  Vitals:   03/07/22 1422 03/07/22 1430  BP: 137/62 133/62  Pulse: 66 64  Resp: 13 15  Temp: 36.5 C   SpO2: 100% 98%    Last Pain:  Vitals:   03/07/22 1422  TempSrc:   PainSc: 0-No pain                 Daran Favaro,E. Parvin Stetzer

## 2022-03-07 NOTE — Anesthesia Procedure Notes (Signed)
Procedure Name: Intubation Date/Time: 03/07/2022 1:45 PM  Performed by: Gwyndolyn Saxon, CRNAPre-anesthesia Checklist: Patient identified, Emergency Drugs available, Suction available and Patient being monitored Patient Re-evaluated:Patient Re-evaluated prior to induction Oxygen Delivery Method: Circle system utilized Preoxygenation: Pre-oxygenation with 100% oxygen Induction Type: IV induction Ventilation: Mask ventilation without difficulty Laryngoscope Size: Miller and 2 Grade View: Grade II Tube type: Oral Tube size: 7.0 mm Number of attempts: 1 Airway Equipment and Method: Patient positioned with wedge pillow and Stylet Placement Confirmation: ETT inserted through vocal cords under direct vision, positive ETCO2 and breath sounds checked- equal and bilateral Secured at: 21 cm Tube secured with: Tape Dental Injury: Teeth and Oropharynx as per pre-operative assessment

## 2022-03-07 NOTE — Transfer of Care (Signed)
Immediate Anesthesia Transfer of Care Note  Patient: Robin Young  Procedure(s) Performed: TRANSURETHRAL RESECTION OF BLADDER TUMOR (TURBT) (Bladder) CYSTOSCOPY WITH STENT REMOVAL (Ureter)  Patient Location: PACU  Anesthesia Type:General  Level of Consciousness: awake and patient cooperative  Airway & Oxygen Therapy: Patient Spontanous Breathing  Post-op Assessment: Report given to RN and Post -op Vital signs reviewed and stable  Post vital signs: Reviewed and stable  Last Vitals:  Vitals Value Taken Time  BP 137/62 03/07/22 1423  Temp    Pulse 66 03/07/22 1423  Resp 13 03/07/22 1423  SpO2 100 % 03/07/22 1423  Vitals shown include unvalidated device data.  Last Pain:  Vitals:   03/07/22 1137  TempSrc:   PainSc: 0-No pain      Patients Stated Pain Goal: 4 (22/29/79 8921)  Complications: No notable events documented.

## 2022-03-08 ENCOUNTER — Encounter (HOSPITAL_COMMUNITY): Payer: Self-pay | Admitting: Urology

## 2022-03-08 LAB — SURGICAL PATHOLOGY

## 2022-10-13 ENCOUNTER — Other Ambulatory Visit: Payer: Self-pay | Admitting: Physician Assistant

## 2022-10-13 DIAGNOSIS — Z1231 Encounter for screening mammogram for malignant neoplasm of breast: Secondary | ICD-10-CM

## 2023-01-02 ENCOUNTER — Ambulatory Visit
Admission: RE | Admit: 2023-01-02 | Discharge: 2023-01-02 | Disposition: A | Discharge status: Home / Self Care | Payer: Medicare Other | Source: Ambulatory Visit | Attending: Physician Assistant

## 2023-01-02 DIAGNOSIS — Z1231 Encounter for screening mammogram for malignant neoplasm of breast: Secondary | ICD-10-CM

## 2023-01-09 LAB — COMPREHENSIVE METABOLIC PANEL WITH GFR
Albumin: 4.3 (ref 3.5–5.0)
Calcium: 9.9 (ref 8.7–10.7)
eGFR: 57

## 2023-01-09 LAB — LIPID PANEL
Cholesterol: 223 — AB (ref 0–200)
HDL: 79 — AB (ref 35–70)
LDL Cholesterol: 133
Triglycerides: 64 (ref 40–160)

## 2023-01-09 LAB — HEPATIC FUNCTION PANEL
ALT: 7 U/L (ref 7–35)
AST: 26 (ref 13–35)
Alkaline Phosphatase: 76 (ref 25–125)
Bilirubin, Total: 0.8

## 2023-01-09 LAB — BASIC METABOLIC PANEL WITH GFR
BUN: 22 — AB (ref 4–21)
CO2: 31 — AB (ref 13–22)
Chloride: 103 (ref 99–108)
Creatinine: 1.1 (ref 0.5–1.1)
Glucose: 98
Potassium: 4.3 meq/L (ref 3.5–5.1)
Sodium: 140 (ref 137–147)

## 2023-01-09 LAB — HEMOGLOBIN A1C: Hemoglobin A1C: 6.2

## 2023-08-07 ENCOUNTER — Telehealth: Payer: Self-pay

## 2023-08-07 NOTE — Telephone Encounter (Signed)
 Copied from CRM [id number]. Topic: Appointments - Appointment Scheduling >> Aug 07, 2023  3:14 PM Allyne Areola wrote: Patient/patient representative is calling to schedule an appointment. Refer to attachments for appointment information. Patient is calling to see if Dr.Scott would be willing to accept her as a new patient. He primary care provider is leaving the practice as [facility] and was given high recommendations for Dr.Scott. I advised that Dr.Scott was not accepting new patients but she would like to know if there was any way to make an exception.

## 2023-08-08 NOTE — Telephone Encounter (Signed)
 See me

## 2023-08-09 NOTE — Telephone Encounter (Signed)
 Called patient but patient did not have good reception and call was disconnected. When patient calls back please transfer to me

## 2023-08-09 NOTE — Telephone Encounter (Signed)
 New patient appt scheduled per Dr Geralyn Knee.

## 2023-10-25 ENCOUNTER — Ambulatory Visit (INDEPENDENT_AMBULATORY_CARE_PROVIDER_SITE_OTHER): Admitting: Internal Medicine

## 2023-10-25 ENCOUNTER — Encounter: Payer: Self-pay | Admitting: Internal Medicine

## 2023-10-25 ENCOUNTER — Other Ambulatory Visit: Payer: Self-pay | Admitting: Internal Medicine

## 2023-10-25 ENCOUNTER — Other Ambulatory Visit (INDEPENDENT_AMBULATORY_CARE_PROVIDER_SITE_OTHER)

## 2023-10-25 VITALS — BP 136/70 | HR 80 | Resp 16 | Ht 64.0 in | Wt 135.6 lb

## 2023-10-25 DIAGNOSIS — Z853 Personal history of malignant neoplasm of breast: Secondary | ICD-10-CM

## 2023-10-25 DIAGNOSIS — Z8551 Personal history of malignant neoplasm of bladder: Secondary | ICD-10-CM | POA: Diagnosis not present

## 2023-10-25 DIAGNOSIS — D649 Anemia, unspecified: Secondary | ICD-10-CM

## 2023-10-25 DIAGNOSIS — R739 Hyperglycemia, unspecified: Secondary | ICD-10-CM

## 2023-10-25 DIAGNOSIS — E7800 Pure hypercholesterolemia, unspecified: Secondary | ICD-10-CM

## 2023-10-25 DIAGNOSIS — N8110 Cystocele, unspecified: Secondary | ICD-10-CM

## 2023-10-25 DIAGNOSIS — Z860100 Personal history of colon polyps, unspecified: Secondary | ICD-10-CM

## 2023-10-25 LAB — LIPID PANEL
Cholesterol: 226 mg/dL — ABNORMAL HIGH (ref 0–200)
HDL: 83.2 mg/dL (ref >39.00)
LDL Cholesterol: 126 mg/dL — ABNORMAL HIGH (ref 0–99)
NonHDL: 142.79
Total CHOL/HDL Ratio: 3
Triglycerides: 84 mg/dL (ref 0.0–149.0)
VLDL: 16.8 mg/dL (ref 0.0–40.0)

## 2023-10-25 LAB — HEPATIC FUNCTION PANEL
ALT: 6 U/L (ref 0–35)
AST: 22 U/L (ref 0–37)
Albumin: 4.3 g/dL (ref 3.5–5.2)
Alkaline Phosphatase: 79 U/L (ref 39–117)
Bilirubin, Direct: 0.2 mg/dL (ref 0.0–0.3)
Total Bilirubin: 0.9 mg/dL (ref 0.2–1.2)
Total Protein: 6.8 g/dL (ref 6.0–8.3)

## 2023-10-25 LAB — CBC WITH DIFFERENTIAL/PLATELET
Basophils Absolute: 0 K/uL (ref 0.0–0.1)
Basophils Relative: 1.1 % (ref 0.0–3.0)
Eosinophils Absolute: 0.1 K/uL (ref 0.0–0.7)
Eosinophils Relative: 1.5 % (ref 0.0–5.0)
HCT: 34.2 % — ABNORMAL LOW (ref 36.0–46.0)
Hemoglobin: 11.4 g/dL — ABNORMAL LOW (ref 12.0–15.0)
Lymphocytes Relative: 27.5 % (ref 12.0–46.0)
Lymphs Abs: 1.1 K/uL (ref 0.7–4.0)
MCHC: 33.5 g/dL (ref 30.0–36.0)
MCV: 95.4 fl (ref 78.0–100.0)
Monocytes Absolute: 0.3 K/uL (ref 0.1–1.0)
Monocytes Relative: 8 % (ref 3.0–12.0)
Neutro Abs: 2.5 K/uL (ref 1.4–7.7)
Neutrophils Relative %: 61.9 % (ref 43.0–77.0)
Platelets: 213 K/uL (ref 150.0–400.0)
RBC: 3.58 Mil/uL — ABNORMAL LOW (ref 3.87–5.11)
RDW: 13.5 % (ref 11.5–15.5)
WBC: 4 K/uL (ref 4.0–10.5)

## 2023-10-25 LAB — IBC + FERRITIN
Ferritin: 57.3 ng/mL (ref 10.0–291.0)
Iron: 104 ug/dL (ref 42–145)
Saturation Ratios: 35.5 % (ref 20.0–50.0)
TIBC: 292.6 ug/dL (ref 250.0–450.0)
Transferrin: 209 mg/dL — ABNORMAL LOW (ref 212.0–360.0)

## 2023-10-25 LAB — BASIC METABOLIC PANEL WITH GFR
BUN: 21 mg/dL (ref 6–23)
CO2: 30 meq/L (ref 19–32)
Calcium: 9.2 mg/dL (ref 8.4–10.5)
Chloride: 103 meq/L (ref 96–112)
Creatinine, Ser: 1.02 mg/dL (ref 0.40–1.20)
GFR: 55.5 mL/min — ABNORMAL LOW (ref >60.00)
Glucose, Bld: 111 mg/dL — ABNORMAL HIGH (ref 70–99)
Potassium: 4.2 meq/L (ref 3.5–5.1)
Sodium: 141 meq/L (ref 135–145)

## 2023-10-25 LAB — VITAMIN B12: Vitamin B-12: 396 pg/mL (ref 211–911)

## 2023-10-25 LAB — TSH: TSH: 1.9 u[IU]/mL (ref 0.35–5.50)

## 2023-10-25 NOTE — Progress Notes (Signed)
Order placed for add on lab.  °

## 2023-10-25 NOTE — Progress Notes (Signed)
 Subjective:    Patient ID: Robin Young, female    DOB: 10/05/1950, 73 y.o.   MRN: [id number]  Patient here for  Chief Complaint  Patient presents with   New Patient (Initial Visit)    HPI Here to establish care. Has been followed at [facility]. Has  a history of breast cancer, hypercholesterolemia, history of bladder cancer, osteopenia and IBS. Has been followed by Dr Gretel Ferrara for her bladder cancer. No receiving BCG treatments now. Off gemtesa. Has f/u planned 11/2023. Reports bladder prolapse. Incontinence is manageable now. Using estrogen cream 2x/week. Kegel exercises. Also reports some allergy issues. Uses flonase. Stays active. No chest congestion or cough. No sob. Saw Dr Donnald - colonoscopy 2017 - tiny polyps - recommended f/u in 10 years. 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 Has seen Dr Elvie Pinal - gyn. Maternal GM with breast cancer. Father with prostate cancer. Gets her mammograms in [place]. States she is up to date. Father - HTN. Mother HTN. Paternal GF - MI. She reports history of borderling cholesterol.    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   Heart murmur 2022   grade 1   History of breast cancer    History of kidney stones    no per pt   IBS (irritable bowel syndrome)    Osteopenia    Personal history of radiation therapy    Shingles 2012   Past Surgical History:  Procedure Laterality Date   BREAST EXCISIONAL BIOPSY  01/2007   BREAST LUMPECTOMY  01/2007   right   carpal tunel  1986   right   CARPAL TUNNEL RELEASE Right    COLONOSCOPY  07/2005   CYSTOSCOPY WITH RETROGRADE PYELOGRAM, URETEROSCOPY AND STENT PLACEMENT Right 02/10/2022   Procedure: CYSTOSCOPY WITH RIGHT RETROGRADE PYELOGRAM, RIGHT STENT PLACEMENT;  Surgeon: Ferrara Gretel,  MD;  Location: [facility];  Service: Urology;  Laterality: Right;   CYSTOSCOPY WITH STENT PLACEMENT N/A 03/07/2022   Procedure: CYSTOSCOPY WITH STENT REMOVAL;  Surgeon: Ferrara Gretel, MD;  Location: [facility];  Service: Urology;  Laterality: N/A;   TRANSURETHRAL RESECTION OF BLADDER TUMOR N/A 02/10/2022   Procedure: TRANSURETHRAL RESECTION OF BLADDER TUMOR (TURBT);  Surgeon: Ferrara Gretel, MD;  Location: [facility];  Service: Urology;  Laterality: N/A;   TRANSURETHRAL RESECTION OF BLADDER TUMOR N/A 03/07/2022   Procedure: TRANSURETHRAL RESECTION OF BLADDER TUMOR (TURBT);  Surgeon: Ferrara Gretel, MD;  Location: [facility];  Service: Urology;  Laterality: N/A;  60 MINUTES NEEDED FOR CASE  ANESTHESIA IS GENERAL WITH PARALYSIS   WISDOM TOOTH EXTRACTION     Family History  Problem Relation Age of Onset   Dementia Mother    Transient ischemic attack Mother    Hearing loss Mother    Varicose Veins Mother    Prostate cancer Father    Arthritis Father    Cancer Father    Hearing loss Father    Hypertension Father    Breast cancer Maternal Grandmother    Cancer Maternal Grandmother    Social History   Socioeconomic History   Marital status: Married    Spouse name: Not on file   Number of children: 2   Years of education: Not on  file   Highest education level: 12th grade  Occupational History    Employer: [employer]  Tobacco Use   Smoking status: Never   Smokeless tobacco: Never  Vaping Use   Vaping status: Never Used  Substance and Sexual Activity   Alcohol use: No   Drug use: No   Sexual activity: Yes    Partners: Male    Birth control/protection: Post-menopausal  Other Topics Concern   Not on file  Social History Narrative   Not on file   Social Drivers of Health   Financial Resource Strain: Low Risk  (10/24/2023)   Overall Financial Resource Strain (CARDIA)    Difficulty of Paying Living Expenses: Not hard at all  Food Insecurity: No Food Insecurity (10/24/2023)    Hunger Vital Sign    Worried About Running Out of Food in the Last Year: Never true    Ran Out of Food in the Last Year: Never true  Transportation Needs: No Transportation Needs (10/24/2023)   PRAPARE - Administrator, Civil Service (Medical): No    Lack of Transportation (Non-Medical): No  Physical Activity: Sufficiently Active (10/24/2023)   Exercise Vital Sign    Days of Exercise per Week: 7 days    Minutes of Exercise per Session: 40 min  Stress: No Stress Concern Present (10/24/2023)   [employer] of Occupational Health - Occupational Stress Questionnaire    Feeling of Stress: Only a little  Social Connections: Unknown (10/24/2023)   Social Connection and Isolation Panel    Frequency of Communication with Friends and Family: More than three times a week    Frequency of Social Gatherings with Friends and Family: Once a week    Attends Religious Services: Patient declined    Database administrator or Organizations: Patient declined    Attends Engineer, structural: Not on file    Marital Status: Married     Review of Systems  Constitutional:  Negative for appetite change and unexpected weight change.  HENT:  Negative for congestion and sinus pressure.   Respiratory:  Negative for cough, chest tightness and shortness of breath.   Cardiovascular:  Negative for chest pain, palpitations and leg swelling.  Gastrointestinal:  Negative for abdominal pain, diarrhea, nausea and vomiting.  Genitourinary:  Negative for difficulty urinating and dysuria.  Musculoskeletal:  Negative for joint swelling and myalgias.  Skin:  Negative for color change and rash.  Neurological:  Negative for dizziness and headaches.  Psychiatric/Behavioral:  Negative for agitation and dysphoric mood.        Objective:     BP 136/70   Pulse 80   Resp 16   Ht 5' 4 (1.626 m)   Wt 135 lb 9.6 oz (61.5 kg)   LMP 11/26/2005   SpO2 99%   BMI 23.28 kg/m  Wt Readings from Last 3  Encounters:  10/25/23 135 lb 9.6 oz (61.5 kg)  03/07/22 133 lb (60.3 kg)  03/02/22 133 lb (60.3 kg)    Physical Exam Vitals reviewed.  Constitutional:      General: She is not in acute distress.    Appearance: Normal appearance.  HENT:     Head: Normocephalic and atraumatic.     Right Ear: External ear normal.     Left Ear: External ear normal.     Mouth/Throat:     Pharynx: No oropharyngeal exudate or posterior oropharyngeal erythema.  Eyes:     General: No scleral icterus.  Right eye: No discharge.        Left eye: No discharge.     Conjunctiva/sclera: Conjunctivae normal.  Neck:     Thyroid : No thyromegaly.  Cardiovascular:     Rate and Rhythm: Normal rate and regular rhythm.  Pulmonary:     Effort: No respiratory distress.     Breath sounds: Normal breath sounds. No wheezing.  Abdominal:     General: Bowel sounds are normal.     Palpations: Abdomen is soft.     Tenderness: There is no abdominal tenderness.  Musculoskeletal:        General: No swelling or tenderness.     Cervical back: Neck supple. No tenderness.  Lymphadenopathy:     Cervical: No cervical adenopathy.  Skin:    Findings: No erythema or rash.  Neurological:     Mental Status: She is alert.  Psychiatric:        Mood and Affect: Mood normal.        Behavior: Behavior normal.         Outpatient Encounter Medications as of 10/25/2023  Medication Sig   acyclovir  ointment (ZOVIRAX ) 5 % Apply 1 application topically every 3 (three) hours. You can use for up to 4 days.   fluticasone (FLONASE) 50 MCG/ACT nasal spray Place 1 spray into both nostrils daily as needed for allergies or rhinitis.   phenazopyridine  (PYRIDIUM ) 200 MG tablet Take 1 tablet (200 mg total) by mouth 3 (three) times daily as needed for pain (burnign with urination).   loratadine (CLARITIN) 10 MG tablet Take 10 mg by mouth daily.   mirabegron ER (MYRBETRIQ) 25 MG TB24 tablet Take 25 mg by mouth daily.   Multiple Vitamin  (MULTIVITAMIN PO) Take 1 tablet by mouth daily.    Psyllium (METAMUCIL PO) Take 2 tablets by mouth daily.   No facility-administered encounter medications on file as of 10/25/2023.     Lab Results  Component Value Date   WBC 4.0 10/25/2023   HGB 11.4 (L) 10/25/2023   HCT 34.2 (L) 10/25/2023   PLT 213.0 10/25/2023   GLUCOSE 111 (H) 10/25/2023   CHOL 226 (H) 10/25/2023   TRIG 84.0 10/25/2023   HDL 83.20 10/25/2023   LDLCALC 126 (H) 10/25/2023   ALT 6 10/25/2023   AST 22 10/25/2023   NA 141 10/25/2023   K 4.2 10/25/2023   CL 103 10/25/2023   CREATININE 1.02 10/25/2023   BUN 21 10/25/2023   CO2 30 10/25/2023   TSH 1.90 10/25/2023   INR 1.1 02/14/2007    MM 3D SCREENING MAMMOGRAM BILATERAL BREAST Result Date: 01/03/2023 CLINICAL DATA:  Screening. EXAM: DIGITAL SCREENING BILATERAL MAMMOGRAM WITH TOMOSYNTHESIS AND CAD TECHNIQUE: Bilateral screening digital craniocaudal and mediolateral oblique mammograms were obtained. Bilateral screening digital breast tomosynthesis was performed. The images were evaluated with computer-aided detection. COMPARISON:  Previous exam(s). ACR Breast Density Category c: The breasts are heterogeneously dense, which may obscure small masses. FINDINGS: There are no findings suspicious for malignancy. IMPRESSION: No mammographic evidence of malignancy. A result letter of this screening mammogram will be mailed directly to the patient. RECOMMENDATION: Screening mammogram in one year. (Code:SM-B-01Y) BI-RADS CATEGORY  1: Negative. Electronically Signed   By: Rosaline Collet M.D.   On: 01/03/2023 18:45       Assessment & Plan:  Hypercholesteremia Assessment & Plan: Low cholesterol diet and exercise. Check lipid panel today.   Orders: -     CBC with Differential/Platelet -     Hepatic function panel -  Lipid panel -     TSH  History of bladder cancer Assessment & Plan: Followed by Dr Gretel Ferrara. F/u planned 11/2023.    Hyperglycemia Assessment &  Plan: Reports history of borderline sugars.Low carb diet. Check met b and A1c today.   Orders: -     Basic metabolic panel with GFR  History of breast cancer Assessment & Plan: 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 Reports is up to date with mammograms. Obtain records.    History of colon polyps Assessment & Plan: Colonoscopy 2017. Reports having tiny polyps reports recommended f/u colonoscopy in 10 years. Dr Donnald.    Female bladder prolapse Assessment & Plan: Reports history of bladder prolapse. Kegel exercises. Manageable now.       Allena Hamilton, MD

## 2023-10-27 ENCOUNTER — Ambulatory Visit: Payer: Self-pay | Admitting: Internal Medicine

## 2023-10-29 ENCOUNTER — Encounter: Payer: Self-pay | Admitting: Internal Medicine

## 2023-10-29 ENCOUNTER — Ambulatory Visit: Payer: Self-pay | Admitting: Internal Medicine

## 2023-10-29 DIAGNOSIS — N8110 Cystocele, unspecified: Secondary | ICD-10-CM | POA: Insufficient documentation

## 2023-10-29 DIAGNOSIS — Z860100 Personal history of colon polyps, unspecified: Secondary | ICD-10-CM | POA: Insufficient documentation

## 2023-10-29 NOTE — Assessment & Plan Note (Signed)
 Followed by Dr Gretel Ferrara. F/u planned 11/2023.

## 2023-10-29 NOTE — Assessment & Plan Note (Signed)
 Reports history of borderline sugars.Low carb diet. Check met b and A1c today.

## 2023-10-29 NOTE — Assessment & Plan Note (Signed)
 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 Reports is up to date with mammograms. Obtain records.

## 2023-10-29 NOTE — Assessment & Plan Note (Signed)
 Reports history of bladder prolapse. Kegel exercises. Manageable now.

## 2023-10-29 NOTE — Assessment & Plan Note (Signed)
 Colonoscopy 2017. Reports having tiny polyps reports recommended f/u colonoscopy in 10 years. Dr Donnald.

## 2023-10-29 NOTE — Assessment & Plan Note (Signed)
Low cholesterol diet and exercise.  Check lipid panel today.   

## 2023-10-30 ENCOUNTER — Other Ambulatory Visit: Payer: Self-pay

## 2023-10-30 ENCOUNTER — Telehealth: Payer: Self-pay

## 2023-10-30 DIAGNOSIS — E7800 Pure hypercholesterolemia, unspecified: Secondary | ICD-10-CM

## 2023-10-30 NOTE — Telephone Encounter (Signed)
Duplicate message; see result note.  

## 2023-10-30 NOTE — Telephone Encounter (Signed)
 Copied from CRM [id number]. Topic: Clinical - Medical Advice >> Oct 30, 2023  3:54 PM Berneda FALCON wrote: Reason for CRM: Patient states Robin Young called and scheduled repeat CBC and she began taking iron supplement 10MG  iron. Does she want her to abstain from taking it prior to the lab in September.  Patient callback is [phone]

## 2023-10-31 ENCOUNTER — Other Ambulatory Visit: Payer: Self-pay | Admitting: Internal Medicine

## 2023-10-31 DIAGNOSIS — D649 Anemia, unspecified: Secondary | ICD-10-CM

## 2023-10-31 NOTE — Progress Notes (Signed)
Order placed for f/u labs.  

## 2023-11-21 ENCOUNTER — Other Ambulatory Visit: Payer: Self-pay | Admitting: Internal Medicine

## 2023-11-21 DIAGNOSIS — Z1231 Encounter for screening mammogram for malignant neoplasm of breast: Secondary | ICD-10-CM

## 2023-12-25 ENCOUNTER — Other Ambulatory Visit

## 2023-12-25 DIAGNOSIS — E7800 Pure hypercholesterolemia, unspecified: Secondary | ICD-10-CM

## 2023-12-25 DIAGNOSIS — D649 Anemia, unspecified: Secondary | ICD-10-CM | POA: Diagnosis not present

## 2023-12-25 LAB — CBC WITH DIFFERENTIAL/PLATELET
Basophils Absolute: 0 K/uL (ref 0.0–0.1)
Basophils Relative: 0.4 % (ref 0.0–3.0)
Eosinophils Absolute: 0.1 K/uL (ref 0.0–0.7)
Eosinophils Relative: 1.5 % (ref 0.0–5.0)
HCT: 34.9 % — ABNORMAL LOW (ref 36.0–46.0)
Hemoglobin: 11.6 g/dL — ABNORMAL LOW (ref 12.0–15.0)
Lymphocytes Relative: 33.6 % (ref 12.0–46.0)
Lymphs Abs: 1.6 K/uL (ref 0.7–4.0)
MCHC: 33.2 g/dL (ref 30.0–36.0)
MCV: 95.7 fl (ref 78.0–100.0)
Monocytes Absolute: 0.4 K/uL (ref 0.1–1.0)
Monocytes Relative: 8.6 % (ref 3.0–12.0)
Neutro Abs: 2.7 K/uL (ref 1.4–7.7)
Neutrophils Relative %: 55.9 % (ref 43.0–77.0)
Platelets: 223 K/uL (ref 150.0–400.0)
RBC: 3.65 Mil/uL — ABNORMAL LOW (ref 3.87–5.11)
RDW: 13.2 % (ref 11.5–15.5)
WBC: 4.8 K/uL (ref 4.0–10.5)

## 2023-12-25 LAB — IBC + FERRITIN
Ferritin: 60.5 ng/mL (ref 10.0–291.0)
Iron: 77 ug/dL (ref 42–145)
Saturation Ratios: 26.4 % (ref 20.0–50.0)
TIBC: 291.2 ug/dL (ref 250.0–450.0)
Transferrin: 208 mg/dL — ABNORMAL LOW (ref 212.0–360.0)

## 2023-12-27 ENCOUNTER — Ambulatory Visit: Payer: Self-pay | Admitting: Internal Medicine

## 2024-01-03 ENCOUNTER — Ambulatory Visit
Admission: RE | Admit: 2024-01-03 | Discharge: 2024-01-03 | Disposition: A | Discharge status: Home / Self Care | Source: Ambulatory Visit | Attending: Internal Medicine | Admitting: Internal Medicine

## 2024-01-03 DIAGNOSIS — Z1231 Encounter for screening mammogram for malignant neoplasm of breast: Secondary | ICD-10-CM

## 2024-01-04 ENCOUNTER — Other Ambulatory Visit: Payer: Self-pay | Admitting: Internal Medicine

## 2024-01-04 NOTE — Telephone Encounter (Signed)
 Copied from CRM [id number]. Topic: Clinical - Medication Refill >> Jan 04, 2024  3:31 PM Alfonso ORN wrote: Medication: loratadine (CLARITIN) 10 MG tablet  Has the patient contacted their pharmacy? NO  This is the patient's preferred pharmacy:  [id number] [place] - [address] AT [facility] [address] [place] [address] Phone: [phone] Fax: [phone]  Is this the correct pharmacy for this prescription? Yes If no, delete pharmacy and type the correct one.   Has the prescription been filled recently? No  Is the patient out of the medication? No  Has the patient been seen for an appointment in the last year OR does the patient have an upcoming appointment? Yes  Can we respond through MyChart? Yes

## 2024-01-05 NOTE — Telephone Encounter (Signed)
 Spoke with patient to clarify Dr Glendia, MD recommendations. Patient states she mentioned the medication in visit with provider. [place] the medication daily. Patient states she prefers a 90-day supply. Patient reached out to her pharmacy and was informed since she was new to Dr Glendia, MD is the reason she reached out her for the refill request, as she missed the timeframe with previous doctor.

## 2024-01-05 NOTE — Telephone Encounter (Signed)
 New pt to me. States historical provider prescribed. Please call and confirm if still taking and see if needs.  Also confirm how often takes.

## 2024-01-06 ENCOUNTER — Encounter: Payer: Self-pay | Admitting: Internal Medicine

## 2024-01-06 DIAGNOSIS — Z0000 Encounter for general adult medical examination without abnormal findings: Secondary | ICD-10-CM | POA: Insufficient documentation

## 2024-01-06 MED ORDER — LORATADINE 10 MG PO TABS: 10.0000 mg | ORAL_TABLET | Freq: Every day | ORAL | 1 refills | Status: AC

## 2024-01-06 NOTE — Telephone Encounter (Signed)
 Rx ok'd for claritin #90 with one refill.

## 2024-04-10 ENCOUNTER — Ambulatory Visit

## 2024-04-10 VITALS — BP 121/76 | Ht 64.0 in | Wt 134.0 lb

## 2024-04-10 DIAGNOSIS — Z0000 Encounter for general adult medical examination without abnormal findings: Secondary | ICD-10-CM

## 2024-04-10 NOTE — Patient Instructions (Signed)
 Robin Young,  Thank you for taking the time for your Medicare Wellness Visit. I appreciate your continued commitment to your health goals. Please review the care plan we discussed, and feel free to reach out if I can assist you further.  Please note that Annual Wellness Visits do not include a physical exam. Some assessments may be limited, especially if the visit was conducted virtually. If needed, we may recommend an in-person follow-up with your provider.  Ongoing Care Seeing your primary care provider every 3 to 6 months helps us  monitor your health and provide consistent, personalized care.  Remember to keep your vaccines up to date and consider having a Dexa scan.   Referrals If a referral was made during today's visit and you haven't received any updates within two weeks, please contact the referred provider directly to check on the status.  Recommended Screenings:  Health Maintenance  Topic Date Due   COVID-19 Vaccine (6 - 2025-26 season) 11/27/2023   Medicare Annual Wellness Visit  04/10/2025   Colon Cancer Screening  08/17/2025   Breast Cancer Screening  01/02/2026   DTaP/Tdap/Td vaccine (3 - Td or Tdap) 12/25/2033   Pneumococcal Vaccine for age over 64  Completed   Flu Shot  Completed   Osteoporosis screening with Bone Density Scan  Completed   Hepatitis C Screening  Completed   Zoster (Shingles) Vaccine  Completed   Meningitis B Vaccine  Aged Out       04/10/2024    8:20 AM  Advanced Directives  Does Patient Have a Medical Advance Directive? Yes  Type of Estate Agent of [place];Living will  Does patient want to make changes to medical advance directive? No - Patient declined  Copy of Healthcare Power of Attorney in Chart? No - copy requested    Vision: Annual vision screenings are recommended for early detection of glaucoma, cataracts, and diabetic retinopathy. These exams can also reveal signs of chronic conditions such as diabetes and high  blood pressure.  Dental: Annual dental screenings help detect early signs of oral cancer, gum disease, and other conditions linked to overall health, including heart disease and diabetes.  Please see the attached documents for additional preventive care recommendations.

## 2024-04-10 NOTE — Progress Notes (Signed)
 "  Chief Complaint  Patient presents with   Medicare Wellness     Subjective:   Robin Young is a 74 y.o. female who presents for a Medicare Annual Wellness Visit.  Visit info / Clinical Intake: Medicare Wellness Visit Type:: Subsequent Annual Wellness Visit Persons participating in visit and providing information:: patient Medicare Wellness Visit Mode:: Telephone If telephone:: video declined Since this visit was completed virtually, some vitals may be partially provided or unavailable. Missing vitals are due to the limitations of the virtual format.: Documented vitals are patient reported If Telephone or Video please confirm:: I connected with patient using audio/video enable telemedicine. I verified patient identity with two identifiers, discussed telehealth limitations, and patient agreed to proceed. Patient Location:: Home Provider Location:: Office/Home Interpreter Needed?: No Pre-visit prep was completed: yes AWV questionnaire completed by patient prior to visit?: no Living arrangements:: lives with spouse/significant other Patient's Overall Health Status Rating: good Typical amount of pain: some (Pelvic floor pain for months) Does pain affect daily life?: (!) yes Are you currently prescribed opioids?: no  Dietary Habits and Nutritional Risks How many meals a day?: 3 Eats fruit and vegetables daily?: yes Most meals are obtained by: preparing own meals In the last 2 weeks, have you had any of the following?: none Diabetic:: no  Functional Status Activities of Daily Living (to include ambulation/medication): Independent Ambulation: Independent Medication Administration: Independent Home Management (perform basic housework or laundry): Independent Manage your own finances?: yes Primary transportation is: driving Concerns about vision?: no *vision screening is required for WTM* Concerns about hearing?: no  Fall Screening Falls in the past year?: 0 Number of falls in  past year: 0 Was there an injury with Fall?: 0 Fall Risk Category Calculator: 0 Patient Fall Risk Level: Low Fall Risk  Fall Risk Patient at Risk for Falls Due to: No Fall Risks Fall risk Follow up: Falls evaluation completed; Falls prevention discussed  Home and Transportation Safety: All rugs have non-skid backing?: yes All stairs or steps have railings?: yes Grab bars in the bathtub or shower?: (!) no Have non-skid surface in bathtub or shower?: yes Good home lighting?: yes Regular seat belt use?: yes Hospital stays in the last year:: no  Cognitive Assessment Difficulty concentrating, remembering, or making decisions? : no Will 6CIT or Mini Cog be Completed: yes What year is it?: 0 points What month is it?: 0 points Give patient an address phrase to remember (5 components): [address] [place] About what time is it?: 0 points Count backwards from 20 to 1: 0 points Say the months of the year in reverse: 0 points Repeat the address phrase from earlier: 0 points 6 CIT Score: 0 points  Advance Directives (For Healthcare) Does Patient Have a Medical Advance Directive?: Yes Does patient want to make changes to medical advance directive?: No - Patient declined Type of Advance Directive: Healthcare Power of [place]; Living will Copy of Healthcare Power of Attorney in Chart?: No - copy requested Copy of Living Will in Chart?: No - copy requested  Reviewed/Updated  Reviewed/Updated: Reviewed All (Medical, Surgical, Family, Medications, Allergies, Care Teams, Patient Goals)    Allergies (verified) Patient has no known allergies.   Current Medications (verified) Outpatient Encounter Medications as of 04/10/2024  Medication Sig   acyclovir  ointment (ZOVIRAX ) 5 % Apply 1 application topically every 3 (three) hours. You can use for up to 4 days.   fluticasone (FLONASE) 50 MCG/ACT nasal spray Place 1 spray into both nostrils daily  as needed for allergies or rhinitis.    loratadine  (CLARITIN ) 10 MG tablet Take 1 tablet (10 mg total) by mouth daily.   mirabegron ER (MYRBETRIQ) 25 MG TB24 tablet Take 25 mg by mouth daily.   Multiple Vitamin (MULTIVITAMIN PO) Take 1 tablet by mouth daily.  (Patient taking differently: Take 1 tablet by mouth daily. Takes 1/2 daily)   phenazopyridine  (PYRIDIUM ) 200 MG tablet Take 1 tablet (200 mg total) by mouth 3 (three) times daily as needed for pain (burnign with urination).   Psyllium (METAMUCIL PO) Take 2 tablets by mouth daily. (Patient taking differently: Take 3 tablets by mouth daily.)   No facility-administered encounter medications on file as of 04/10/2024.    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   Heart murmur 2022   grade 1   History of breast cancer    History of kidney stones    no per pt   IBS (irritable bowel syndrome)    Osteopenia    Personal history of radiation therapy    Shingles 2012   Past Surgical History:  Procedure Laterality Date   BREAST EXCISIONAL BIOPSY  01/2007   BREAST LUMPECTOMY  01/2007   right   carpal tunel  1986   right   CARPAL TUNNEL RELEASE Right    COLONOSCOPY  07/2005   CYSTOSCOPY WITH RETROGRADE PYELOGRAM, URETEROSCOPY AND STENT PLACEMENT Right 02/10/2022   Procedure: CYSTOSCOPY WITH RIGHT RETROGRADE PYELOGRAM, RIGHT STENT PLACEMENT;  Surgeon: Renda Glance, MD;  Location: [facility];  Service: Urology;  Laterality: Right;   CYSTOSCOPY WITH STENT PLACEMENT N/A 03/07/2022   Procedure: CYSTOSCOPY WITH STENT REMOVAL;  Surgeon: Renda Glance, MD;  Location: [facility];  Service: Urology;  Laterality: N/A;   TRANSURETHRAL RESECTION OF BLADDER TUMOR N/A 02/10/2022   Procedure: TRANSURETHRAL RESECTION OF BLADDER TUMOR (TURBT);  Surgeon: Renda Glance, MD;  Location: [facility];  Service: Urology;   Laterality: N/A;   TRANSURETHRAL RESECTION OF BLADDER TUMOR N/A 03/07/2022   Procedure: TRANSURETHRAL RESECTION OF BLADDER TUMOR (TURBT);  Surgeon: Renda Glance, MD;  Location: [facility];  Service: Urology;  Laterality: N/A;  60 MINUTES NEEDED FOR CASE  ANESTHESIA IS GENERAL WITH PARALYSIS   WISDOM TOOTH EXTRACTION     Family History  Problem Relation Age of Onset   Dementia Mother    Transient ischemic attack Mother    Hearing loss Mother    Varicose Veins Mother    Prostate cancer Father    Arthritis Father    Cancer Father    Hearing loss Father    Hypertension Father    Breast cancer Maternal Grandmother    Cancer Maternal Grandmother    Social History   Occupational History    Employer: [employer]  Tobacco Use   Smoking status: Never   Smokeless tobacco: Never  Vaping Use   Vaping status: Never Used  Substance and Sexual Activity   Alcohol use: No   Drug use: No   Sexual activity: Yes    Partners: Male    Birth control/protection: Post-menopausal   Tobacco Counseling Counseling given: Not Answered  SDOH Screenings   Food Insecurity: No Food Insecurity (04/10/2024)  Housing: Low Risk (04/10/2024)  Transportation Needs: No Transportation Needs (04/10/2024)  Utilities:  Not At Risk (04/10/2024)  Alcohol Screen: Low Risk (04/10/2024)  Depression (PHQ2-9): Low Risk (04/10/2024)  Financial Resource Strain: Low Risk (04/10/2024)  Physical Activity: Sufficiently Active (04/10/2024)  Social Connections: Moderately Isolated (04/10/2024)  Stress: No Stress Concern Present (04/10/2024)  Tobacco Use: Low Risk (04/10/2024)  Health Literacy: Adequate Health Literacy (04/10/2024)   See flowsheets for full screening details  Depression Screen PHQ 2 & 9 Depression Scale- Over the past 2 weeks, how often have you been bothered by any of the following problems? Little interest or pleasure in doing things: 0 Feeling down, depressed, or hopeless (PHQ Adolescent also  includes...irritable): 0 PHQ-2 Total Score: 0 Trouble falling or staying asleep, or sleeping too much: 0 Feeling tired or having little energy: 0 Poor appetite or overeating (PHQ Adolescent also includes...weight loss): 0 Feeling bad about yourself - or that you are a failure or have let yourself or your family down: 0 Trouble concentrating on things, such as reading the newspaper or watching television (PHQ Adolescent also includes...like school work): 0 Moving or speaking so slowly that other people could have noticed. Or the opposite - being so fidgety or restless that you have been moving around a lot more than usual: 0 Thoughts that you would be better off dead, or of hurting yourself in some way: 0 PHQ-9 Total Score: 0 If you checked off any problems, how difficult have these problems made it for you to do your work, take care of things at home, or get along with other people?: Not difficult at all     Goals Addressed             This Visit's Progress    Patient Stated       Wants to get her activity level back up             Objective:    Today's Vitals   04/10/24 0817  BP: 121/76  Weight: 134 lb (60.8 kg)  Height: 5' 4 (1.626 m)   Body mass index is 23 kg/m.  Hearing/Vision screen Hearing Screening - Comments:: No issues Vision Screening - Comments:: Glasses, [facility], up to date Immunizations and Health Maintenance Health Maintenance  Topic Date Due   DTaP/Tdap/Td (2 - Td or Tdap) 08/31/2023   Influenza Vaccine  10/27/2023   COVID-19 Vaccine (6 - 2025-26 season) 11/27/2023   Medicare Annual Wellness (AWV)  04/10/2025   Colonoscopy  08/17/2025   Mammogram  01/02/2026   Pneumococcal Vaccine: 50+ Years  Completed   Bone Density Scan  Completed   Hepatitis C Screening  Completed   Zoster Vaccines- Shingrix  Completed   Meningococcal B Vaccine  Aged Out        Assessment/Plan:  This is a routine wellness examination for [facility].  Patient Care  Team: Glendia Shad, MD as PCP - General (Internal Medicine) Shannon Agent, MD (Radiation Oncology) Merrilyn Handler, MD (Inactive) (General Surgery) Cleotilde Ronal RAMAN, MD as Consulting Physician (Gynecology) Renda Glance, MD as Consulting Physician (Urology)  I have personally reviewed and noted the following in the patients chart:   Medical and social history Use of alcohol, tobacco or illicit drugs  Current medications and supplements including opioid prescriptions. Functional ability and status Nutritional status Physical activity Advanced directives List of other physicians Hospitalizations, surgeries, and ER visits in previous 12 months Vitals Screenings to include cognitive, depression, and falls Referrals and appointments  No orders of the defined types were placed in this encounter.  In addition, I have  reviewed and discussed with patient certain preventive protocols, quality metrics, and best practice recommendations. A written personalized care plan for preventive services as well as general preventive health recommendations were provided to patient.   Angeline Fredericks, LPN   8/85/7973   Return in 1 year (on 04/10/2025).  After Visit Summary: (MyChart) Due to this being a telephonic visit, the after visit summary with patients personalized plan was offered to patient via MyChart   Nurse Notes: Patient declines covid vaccine and Dexa scan at this time. "

## 2024-04-19 ENCOUNTER — Telehealth: Admitting: Physician Assistant

## 2024-04-19 ENCOUNTER — Ambulatory Visit: Payer: Self-pay

## 2024-04-19 DIAGNOSIS — J329 Chronic sinusitis, unspecified: Secondary | ICD-10-CM

## 2024-04-19 DIAGNOSIS — B9789 Other viral agents as the cause of diseases classified elsewhere: Secondary | ICD-10-CM

## 2024-04-19 MED ORDER — DOXYCYCLINE HYCLATE 100 MG PO TABS: 100.0000 mg | ORAL_TABLET | Freq: Two times a day (BID) | ORAL | 0 refills | Status: AC

## 2024-04-19 NOTE — Telephone Encounter (Signed)
" °  FYI Only or Action Required?: FYI only for provider: appointment scheduled on 1/23.  Patient was last seen in primary care on 10/25/2023 by Glendia Shad, MD.  Called Nurse Triage reporting Sinusitis.  Symptoms began 4 days ago.  Interventions attempted: OTC medications: flonase.  Symptoms are: stable.  Triage Disposition: Home Care  Patient/caregiver understands and will follow disposition?: Yes  Reason for Triage: sinus infection. Drainage, choking at night    Reason for Disposition  [1] Sinus congestion as part of a cold AND [2] present < 10 days  Answer Assessment - Initial Assessment Questions Caller concerned sympotms will worsen with bad weather coming. [facility] appt scheduled and given my chart help desk number in case needed.    1. LOCATION: Where does it hurt?      Between eyes 2. ONSET: When did the sinus pain start?  (e.g., hours, days)      Few days ago 3. SEVERITY: How bad is the pain?   (Scale 0-10; or none, mild, moderate or severe)     mild 4. RECURRENT SYMPTOM: Have you ever had sinus problems before? If Yes, ask: When was the last time? and What happened that time?       5. NASAL CONGESTION: Is the nose blocked? If Yes, ask: Can you open it or must you breathe through your mouth?     yes 6. NASAL DISCHARGE: Do you have discharge from your nose? If so ask, What color?     yes 7. FEVER: Do you have a fever? If Yes, ask: What is it, how was it measured, and when did it start?      denies 8. OTHER SYMPTOMS: Do you have any other symptoms? (e.g., sore throat, cough, earache, difficulty breathing)     no  Protocols used: Sinus Pain or Congestion-A-AH  "

## 2024-04-19 NOTE — Telephone Encounter (Signed)
 Evaluated today - per note.

## 2024-04-19 NOTE — Patient Instructions (Signed)
" °  Marcos KANDICE Reeve, thank you for joining [name], PA-C for today's virtual visit.  While this provider is not your primary care provider (PCP), if your PCP is located in our provider database this encounter information will be shared with them immediately following your visit.   A [facility] MyChart account gives you access to today's visit and all your visits, tests, and labs performed at [facility]  click here if you don't have a [facility] MyChart account or go to [email or web address]  Consent: (Patient) ILIYANA CONVEY provided verbal consent for this virtual visit at the beginning of the encounter.  Current Medications:  Current Outpatient Medications:    acyclovir  ointment (ZOVIRAX ) 5 %, Apply 1 application topically every 3 (three) hours. You can use for up to 4 days., Disp: 5 g, Rfl: 1   fluticasone (FLONASE) 50 MCG/ACT nasal spray, Place 1 spray into both nostrils daily as needed for allergies or rhinitis., Disp: , Rfl:    loratadine  (CLARITIN ) 10 MG tablet, Take 1 tablet (10 mg total) by mouth daily., Disp: 90 tablet, Rfl: 1   mirabegron ER (MYRBETRIQ) 25 MG TB24 tablet, Take 25 mg by mouth daily., Disp: , Rfl:    Multiple Vitamin (MULTIVITAMIN PO), Take 1 tablet by mouth daily.  (Patient taking differently: Take 1 tablet by mouth daily. Takes 1/2 daily), Disp: , Rfl:    phenazopyridine  (PYRIDIUM ) 200 MG tablet, Take 1 tablet (200 mg total) by mouth 3 (three) times daily as needed for pain (burnign with urination)., Disp: 20 tablet, Rfl: 0   Psyllium (METAMUCIL PO), Take 2 tablets by mouth daily. (Patient taking differently: Take 3 tablets by mouth daily.), Disp: , Rfl:    Medications ordered in this encounter:  No orders of the defined types were placed in this encounter.    *If you need refills on other medications prior to your next appointment, please contact your pharmacy*  Follow-Up: Call back or seek an in-person evaluation if the symptoms  worsen or if the condition fails to improve as anticipated.  [facility] [phone]  Other Instructions Please hydrate and rest. Start a saline nasal rinse. Continue your Flonase nasal spray. Restart your Claritin . Monitor symptoms for improvement over next 48-72 hours. If any progressively worsening symptoms, new onset facial pain or recurrence of fever, start taking the Doxycycline I have put on file at the pharmacy. If you note any non-resolving, new, or worsening symptoms despite treatment, please seek an in-person evaluation ASAP.   Feel better soon and try to stay safe and warm with the upcoming weather!   If you have been instructed to have an in-person evaluation today at a local Urgent Care facility, please use the link below. It will take you to a list of all of our available [facility], including address, phone number and hours of operation. Please do not delay care.  [facility]  If you or a family member do not have a primary care provider, use the link below to schedule a visit and establish care. When you choose a [facility] primary care physician or advanced practice provider, you gain a long-term partner in health. Find a Primary Care Provider  Learn more about [facility]'s in-office and virtual care options: [facility] - Get Care Now  "
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
## 2024-10-24 ENCOUNTER — Ambulatory Visit: Admitting: Internal Medicine

## 2025-04-15 ENCOUNTER — Ambulatory Visit
